# Patient Record
Sex: Female | Born: 1995 | Race: White | Hispanic: No | Marital: Single | Smoking: Never smoker
Health system: Southern US, Community
[De-identification: ages and names within clinical notes are randomized; demographics above are authoritative.]

## PROBLEM LIST (undated history)

## (undated) ENCOUNTER — Inpatient Hospital Stay: Payer: Self-pay

## (undated) DIAGNOSIS — F419 Anxiety disorder, unspecified: Secondary | ICD-10-CM

## (undated) DIAGNOSIS — Z8759 Personal history of other complications of pregnancy, childbirth and the puerperium: Secondary | ICD-10-CM

## (undated) DIAGNOSIS — Z98891 History of uterine scar from previous surgery: Secondary | ICD-10-CM

## (undated) DIAGNOSIS — Z9889 Other specified postprocedural states: Secondary | ICD-10-CM

## (undated) DIAGNOSIS — N870 Mild cervical dysplasia: Secondary | ICD-10-CM

## (undated) DIAGNOSIS — K219 Gastro-esophageal reflux disease without esophagitis: Secondary | ICD-10-CM

## (undated) DIAGNOSIS — Z789 Other specified health status: Secondary | ICD-10-CM

## (undated) DIAGNOSIS — R112 Nausea with vomiting, unspecified: Secondary | ICD-10-CM

## (undated) HISTORY — PX: TONSILLECTOMY: SUR1361

## (undated) HISTORY — PX: WISDOM TOOTH EXTRACTION: SHX21

---
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
## 2016-01-20 ENCOUNTER — Encounter: Payer: Self-pay | Admitting: Emergency Medicine

## 2016-01-20 DIAGNOSIS — R1030 Lower abdominal pain, unspecified: Secondary | ICD-10-CM | POA: Diagnosis present

## 2016-01-20 DIAGNOSIS — N390 Urinary tract infection, site not specified: Secondary | ICD-10-CM | POA: Insufficient documentation

## 2016-01-20 LAB — COMPREHENSIVE METABOLIC PANEL
ALT: 20 U/L (ref 14–54)
AST: 19 U/L (ref 15–41)
Albumin: 5.2 g/dL — ABNORMAL HIGH (ref 3.5–5.0)
Alkaline Phosphatase: 67 U/L (ref 38–126)
Anion gap: 7 (ref 5–15)
BILIRUBIN TOTAL: 0.6 mg/dL (ref 0.3–1.2)
BUN: 8 mg/dL (ref 6–20)
CHLORIDE: 107 mmol/L (ref 101–111)
CO2: 26 mmol/L (ref 22–32)
CREATININE: 0.84 mg/dL (ref 0.44–1.00)
Calcium: 9.8 mg/dL (ref 8.9–10.3)
Glucose, Bld: 94 mg/dL (ref 65–99)
POTASSIUM: 3.8 mmol/L (ref 3.5–5.1)
Sodium: 140 mmol/L (ref 135–145)
TOTAL PROTEIN: 7.9 g/dL (ref 6.5–8.1)

## 2016-01-20 LAB — CBC
HEMATOCRIT: 46.6 % (ref 35.0–47.0)
Hemoglobin: 16.5 g/dL — ABNORMAL HIGH (ref 12.0–16.0)
MCH: 30.4 pg (ref 26.0–34.0)
MCHC: 35.5 g/dL (ref 32.0–36.0)
MCV: 85.6 fL (ref 80.0–100.0)
Platelets: 207 10*3/uL (ref 150–440)
RBC: 5.44 MIL/uL — ABNORMAL HIGH (ref 3.80–5.20)
RDW: 12.9 % (ref 11.5–14.5)
WBC: 10.2 10*3/uL (ref 3.6–11.0)

## 2016-01-20 LAB — URINALYSIS COMPLETE WITH MICROSCOPIC (ARMC ONLY)
BILIRUBIN URINE: NEGATIVE
GLUCOSE, UA: NEGATIVE mg/dL
Ketones, ur: NEGATIVE mg/dL
NITRITE: NEGATIVE
Protein, ur: NEGATIVE mg/dL
Specific Gravity, Urine: 1.017 (ref 1.005–1.030)
pH: 6 (ref 5.0–8.0)

## 2016-01-20 LAB — LIPASE, BLOOD: LIPASE: 42 U/L (ref 11–51)

## 2016-01-20 LAB — POCT PREGNANCY, URINE: Preg Test, Ur: NEGATIVE

## 2016-01-20 NOTE — ED Triage Notes (Signed)
Patient reports that has had intermittent abd pain times two weeks. Patient reports that when the pain began it was in her right lower abd. Patient reports that over the last 3 days that pain has become worse and that the pain has been in her left upper abd. Patient also reports some intermittent dizziness. Patient denies nausea, vomiting, diarrhea, fevers or urinary symptoms.

## 2016-01-21 ENCOUNTER — Emergency Department
Admission: EM | Admit: 2016-01-21 | Discharge: 2016-01-21 | Disposition: A | Discharge status: Home / Self Care | Payer: Medicaid Other | Attending: Emergency Medicine | Admitting: Emergency Medicine

## 2016-01-21 DIAGNOSIS — R1030 Lower abdominal pain, unspecified: Secondary | ICD-10-CM

## 2016-01-21 DIAGNOSIS — N390 Urinary tract infection, site not specified: Secondary | ICD-10-CM

## 2016-01-21 MED ORDER — CEPHALEXIN 500 MG PO CAPS: 500.0000 mg | ORAL_CAPSULE | Freq: Three times a day (TID) | ORAL | 0 refills | Status: DC

## 2016-01-21 MED ORDER — CEPHALEXIN 500 MG PO CAPS
500.0000 mg | ORAL_CAPSULE | Freq: Once | ORAL | Status: AC
  Administered 2016-01-21: 500 mg via ORAL
  Filled 2016-01-21: qty 1

## 2016-01-21 NOTE — ED Provider Notes (Signed)
[facility] Emergency Department Provider Note   ____________________________________________   First MD Initiated Contact with Patient 01/21/16 [phone]     (approximate)  I have reviewed the triage vital signs and the nursing notes.   HISTORY  Chief Complaint Abdominal Pain    HPI Grace Griffin is a 20 y.o. female who presents to the ED with a chief complaint of abdominal pain. Patient reports right upper abdominal pain 2 weeks ago. Now presenting with lower abdominal pain and sometimes left upper pain. Denies associated fever, chills, chest pain, shortness of breath, nausea, vomiting, diarrhea. 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 Reports she used a Summer's Eve douche 2 weeks ago. Denies recent travel or trauma. Nothing makes her symptoms better or worse.   Past medical history None  There are no active problems to display for this patient.   Past Surgical History:  Procedure Laterality Date  . TONSILLECTOMY    . WISDOM TOOTH EXTRACTION      Prior to Admission medications   Medication Sig Start Date End Date Taking? Authorizing Provider  cephALEXin (KEFLEX) 500 MG capsule Take 1 capsule (500 mg total) by mouth 3 (three) times daily. 01/21/16   Irean HongJade J Sung, MD  ondansetron (ZOFRAN ODT) 4 MG disintegrating tablet Take 1 tablet (4 mg total) by mouth every 6 (six) hours as needed for nausea or vomiting. 07/05/15   Sharyn CreamerMark Quale, MD    Allergies Review of patient's allergies indicates no known allergies.  No family history on file.  Social History Social History  Substance Use Topics  . Smoking status: Never Smoker  . Smokeless tobacco: Never Used  . Alcohol use No    Review of Systems  Constitutional: No fever/chills. Eyes: No visual changes. ENT: No sore throat. Cardiovascular: Denies chest pain. Respiratory: Denies shortness of breath. Gastrointestinal: Positive for abdominal pain.  No  nausea, no vomiting.  No diarrhea.  No constipation. Genitourinary: Negative for dysuria. Musculoskeletal: Negative for back pain. Skin: Negative for rash. Neurological: Negative for headaches, focal weakness or numbness.  10-point ROS otherwise negative.  ____________________________________________   PHYSICAL EXAM:  VITAL SIGNS: ED Triage Vitals  Enc Vitals Group     BP 01/20/16 2245 127/85     Pulse Rate 01/20/16 2245 87     Resp 01/20/16 2245 18     Temp 01/20/16 2245 98.1 F (36.7 C)     Temp Source 01/20/16 2245 Oral     SpO2 01/20/16 2245 98 %     Weight 01/20/16 2246 140 lb (63.5 kg)     Height 01/20/16 2246 5\' 3"  (1.6 m)     Head Circumference --      Peak Flow --      Pain Score 01/20/16 2246 3     Pain Loc --      Pain Edu? --      Excl. in GC? --     Constitutional: Alert and oriented. Well appearing and in no acute distress. Eyes: Conjunctivae are normal. PERRL. EOMI. Head: Atraumatic. Nose: No congestion/rhinnorhea. Mouth/Throat: Mucous membranes are moist.  Oropharynx non-erythematous. Neck: No stridor.   Cardiovascular: Normal rate, regular rhythm. Grossly normal heart sounds.  Good peripheral circulation. Respiratory: Normal respiratory effort.  No retractions. Lungs CTAB. Gastrointestinal: Soft and nontender to light and deep palpation. No distention. No abdominal bruits. No CVA tenderness. Musculoskeletal: No lower extremity tenderness nor edema.  No joint effusions. Neurologic:  Normal  speech and language. No gross focal neurologic deficits are appreciated. No gait instability. Skin:  Skin is warm, dry and intact. No rash noted. Psychiatric: Mood and affect are normal. Speech and behavior are normal.  ____________________________________________   LABS (all labs ordered are listed, but only abnormal results are displayed)  Labs Reviewed  COMPREHENSIVE METABOLIC PANEL - Abnormal; Notable for the following:       Result Value   Albumin 5.2 (*)     All other components within normal limits  CBC - Abnormal; Notable for the following:    RBC 5.44 (*)    Hemoglobin 16.5 (*)    All other components within normal limits  URINALYSIS COMPLETEWITH MICROSCOPIC ([facility] ONLY) - Abnormal; Notable for the following:    Color, Urine YELLOW (*)    APPearance HAZY (*)    Hgb urine dipstick 1+ (*)    Leukocytes, UA 3+ (*)    Bacteria, UA RARE (*)    Squamous Epithelial / LPF 6-30 (*)    All other components within normal limits  LIPASE, BLOOD  POCT PREGNANCY, URINE  POC URINE PREG, ED   ____________________________________________  EKG  None ____________________________________________  RADIOLOGY  None ____________________________________________   PROCEDURES  Procedure(s) performed: None  Procedures  Critical Care performed: No  ____________________________________________   INITIAL IMPRESSION / ASSESSMENT AND PLAN / ED COURSE  Pertinent labs & imaging results that were available during my care of the patient were reviewed by me and considered in my medical decision making (see chart for details).  20 year old female who presents with abdominal pain without associated nausea, vomiting or diarrhea. She is well appearing on exam, abdomen benign with 3+ leukocyte and TNTC WBC in her urine. Patient declines IV or IM medications. In fact, states she cannot swallow a pill. Will treat with Keflex capsules that she can break open and mix with applesauce. Strict return precautions given. Patient verbalizes understanding and agrees with plan of care.  Clinical Course     ____________________________________________   FINAL CLINICAL IMPRESSION(S) / ED DIAGNOSES  Final diagnoses:  Lower abdominal pain  UTI (lower urinary tract infection)      NEW MEDICATIONS STARTED DURING THIS VISIT:  New Prescriptions   CEPHALEXIN (KEFLEX) 500 MG CAPSULE    Take 1 capsule (500 mg total) by mouth 3 (three) times daily.     Note:   This document was prepared using Dragon voice recognition software and may include unintentional dictation errors.    Irean HongJade J Sung, MD 01/21/16 [phone]

## 2016-01-21 NOTE — Discharge Instructions (Signed)
1. Take antibiotic as prescribed (Keflex 500 mg 3 times daily 7 days). 2. Return to the ER for worsening symptoms, persistent vomiting, fever or other concerns.

## 2017-01-25 ENCOUNTER — Telehealth: Payer: Self-pay | Admitting: Obstetrics and Gynecology

## 2017-01-25 NOTE — Telephone Encounter (Signed)
Called and spoke with patient in regards to referral from [facility] practice, The patient declined appointment, due to her having medicaid family planning, The patient is not able to afford the services. The patient was contacted and advised to go to the health department and also [facility] practice was contacted and informed that the patient declined. Thank you.

## 2017-03-26 ENCOUNTER — Encounter: Payer: Self-pay | Admitting: Emergency Medicine

## 2017-03-26 ENCOUNTER — Emergency Department
Admission: EM | Admit: 2017-03-26 | Discharge: 2017-03-26 | Disposition: A | Discharge status: Home / Self Care | Payer: Medicaid Other | Attending: Emergency Medicine | Admitting: Emergency Medicine

## 2017-03-26 DIAGNOSIS — M545 Low back pain: Secondary | ICD-10-CM | POA: Insufficient documentation

## 2017-03-26 DIAGNOSIS — Y9389 Activity, other specified: Secondary | ICD-10-CM | POA: Diagnosis not present

## 2017-03-26 DIAGNOSIS — Z79899 Other long term (current) drug therapy: Secondary | ICD-10-CM | POA: Insufficient documentation

## 2017-03-26 DIAGNOSIS — Y999 Unspecified external cause status: Secondary | ICD-10-CM | POA: Diagnosis not present

## 2017-03-26 DIAGNOSIS — Y929 Unspecified place or not applicable: Secondary | ICD-10-CM | POA: Insufficient documentation

## 2017-03-26 MED ORDER — IBUPROFEN 600 MG PO TABS: 600.0000 mg | ORAL_TABLET | Freq: Four times a day (QID) | ORAL | 0 refills | Status: DC | PRN

## 2017-03-26 MED ORDER — CYCLOBENZAPRINE HCL 5 MG PO TABS
5.0000 mg | ORAL_TABLET | Freq: Three times a day (TID) | ORAL | 0 refills | Status: AC | PRN
Start: 2017-03-26 — End: 2017-04-02

## 2017-03-26 MED ORDER — DIAZEPAM 2 MG PO TABS
2.0000 mg | ORAL_TABLET | Freq: Once | ORAL | Status: AC
  Administered 2017-03-26: 2 mg via ORAL
  Filled 2017-03-26: qty 1

## 2017-03-26 MED ORDER — IBUPROFEN 600 MG PO TABS
600.0000 mg | ORAL_TABLET | Freq: Once | ORAL | Status: AC
  Administered 2017-03-26: 600 mg via ORAL
  Filled 2017-03-26: qty 1

## 2017-03-26 MED ORDER — ORPHENADRINE CITRATE 30 MG/ML IJ SOLN
60.0000 mg | Freq: Two times a day (BID) | INTRAMUSCULAR | Status: DC
  Administered 2017-03-26: 60 mg via INTRAMUSCULAR
  Filled 2017-03-26: qty 2
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
## 2017-06-02 ENCOUNTER — Telehealth: Payer: Self-pay

## 2017-06-02 NOTE — Telephone Encounter (Signed)
Pt has PNV from [facility], has been taking them fine until last night she had trouble getting it down.  She finally had to chop it up to get it down.  She wanted to know if there was anything otc with the same things in it that was a gummy.  Adv she could take current bottle with her to pharm and compare ingredients and have pharmacist help her or wait until next appt and discuss with provider.  Pt sounded like she would wait and talk c provider.

## 2017-06-07 ENCOUNTER — Encounter: Payer: Self-pay | Admitting: Maternal Newborn

## 2017-06-07 ENCOUNTER — Ambulatory Visit (INDEPENDENT_AMBULATORY_CARE_PROVIDER_SITE_OTHER): Payer: Medicaid Other | Admitting: Maternal Newborn

## 2017-06-07 VITALS — BP 120/80 | Wt 183.0 lb

## 2017-06-07 DIAGNOSIS — Z6834 Body mass index (BMI) 34.0-34.9, adult: Secondary | ICD-10-CM | POA: Insufficient documentation

## 2017-06-07 DIAGNOSIS — Z0189 Encounter for other specified special examinations: Secondary | ICD-10-CM

## 2017-06-07 DIAGNOSIS — Z3400 Encounter for supervision of normal first pregnancy, unspecified trimester: Secondary | ICD-10-CM

## 2017-06-07 DIAGNOSIS — Z6831 Body mass index (BMI) 31.0-31.9, adult: Secondary | ICD-10-CM

## 2017-06-07 NOTE — Progress Notes (Signed)
06/07/2017   Chief Complaint: Amenorrhea, positive home pregnancy test, desires prenatal care.  Transfer of Care Patient: no   History of Present Illness: Ms. Barth KirksMichaels is a 22 y.o. G1P0000 at 7587w1d based on Patient's last menstrual period on 04/25/2017 with an Estimated Date of Delivery: 01/30/18, with the above CC.   Her periods were: irregular periods, had no cycles for 9 months after stopping DepoProvera, then approximately monthly the past few cycles. She was using no method when she conceived.  She has Positive signs or symptoms of nausea/vomiting of pregnancy. She has Negative signs or symptoms of miscarriage or preterm labor. She identifies Negative Zika risk factors for her and her partner. On any different medications around the time she conceived/early pregnancy: No.  History of varicella: Yes.   ROS: A 12-point review of systems was performed and negative, except as stated in the above HPI.  OBGYN History: As per HPI. OB History  Gravida Para Term Preterm AB Living  1 0 0 0 0 0  SAB TAB Ectopic Multiple Live Births  0 0 0 0 0    # Outcome Date GA Lbr Len/2nd Weight Sex Delivery Anes PTL Lv  1 Current               Any issues with any prior pregnancies: not applicable Any prior children are healthy, doing well, without any problems or issues: not applicable History of pap smears: Yes. Last pap smear done in 2018 per patient with normal cells and positive HPV (note, not indicated as patient is <21).  History of STIs: Yes, chlamydia and HPV   Past Medical History: History reviewed. No pertinent past medical history.  Past Surgical History: Past Surgical History:  Procedure Laterality Date  . TONSILLECTOMY     AND ADENOIDECTOMY  . WISDOM TOOTH EXTRACTION      Family History:  Family History  Problem Relation Age of Onset  . Breast cancer Paternal Grandmother    She denies any female cancers, bleeding or blood clotting disorders.  She denies any history of  intellectual disability, birth defects or genetic disorders in her or the FOB's history.  Social History:  Social History   Socioeconomic History  . Marital status: Single    Spouse name: Not on file  . Number of children: Not on file  . Years of education: Not on file  . Highest education level: Not on file  Social Needs  . Financial resource strain: Not on file  . Food insecurity - worry: Not on file  . Food insecurity - inability: Not on file  . Transportation needs - medical: Not on file  . Transportation needs - non-medical: Not on file  Occupational History  . Not on file  Tobacco Use  . Smoking status: Never Smoker  . Smokeless tobacco: Never Used  Substance and Sexual Activity  . Alcohol use: No  . Drug use: No  . Sexual activity: Yes  Other Topics Concern  . Not on file  Social History Narrative  . Not on file   Any cats in the household: no. Denies history of and current domestic violence.  Allergy: No Known Allergies  Current Outpatient Medications: No current outpatient medications on file.   Physical Exam:   BP 120/80   Wt 183 lb (83 kg)   LMP 04/25/2017   BMI 32.42 kg/m  Body mass index is 32.42 kg/m. Constitutional: Well nourished, well developed female in no acute distress.  Neck:  Supple, normal appearance, and  no thyromegaly  Cardiovascular: S1, S2 normal, no murmur, rub or gallop, regular rate and rhythm Respiratory:  Clear to auscultation bilaterally. Normal respiratory effort Abdomen: no masses, hernias; diffusely non tender to palpation, non distended Breasts: breasts appear normal, no suspicious masses, no skin or nipple changes or axillary nodes, risk and benefit of breast self-exam was discussed. Neuro/Psych:  Normal mood and affect.  Skin:  Warm and dry.  Lymphatic:  No inguinal lymphadenopathy.   Pelvic exam: is not limited by body habitus External genitalia, Bartholin's glands, Urethra, Skene's glands: within normal  limits Vagina: within normal limits and with no blood in the vault  Cervix: normal appearing cervix without discharge or lesions, closed/long/high Uterus:  enlarged, c/w early pregnancy Adnexa:  no mass, fullness, tenderness  Assessment: Ms. Shadowens is a 22 y.o. G1P0000 [redacted]w[redacted]d based on Patient's last menstrual period was 04/25/2017. with an Estimated Date of Delivery: 01/30/18, presenting for prenatal care.  Plan:  1) Avoid alcoholic beverages. 2) Patient encouraged not to smoke.  3) Discontinue the use of all non-medicinal drugs and chemicals.  4) Take prenatal vitamins daily. Unable to tolerate regular vitamins and is using gummies. Advised about folate and iron and gave vitamin samples today. 5) Seatbelt use advised 6) Nutrition, food safety (fish, cheese advisories, and high nitrite foods) and exercise discussed. 7) Hospital and practice style delivering at [facility] discussed  8) Patient is asked about travel to areas at risk for the Zika virus, and counseled to avoid travel and exposure to mosquitoes or sexual partners who may have themselves been exposed to the virus. Testing is discussed, and will be ordered as appropriate.  9) Childbirth classes at [facility] advised 10) Genetic Screening, such as with 1st Trimester Screening, cell free fetal DNA, AFP testing, and Ultrasound, as well as with amniocentesis and CVS as appropriate, is discussed with patient. She plans to have genetic testing this pregnancy. 11) GTT ordered for BMI of 31, will return fasting  for lab draw with NOB labs. 12) Dating/viability ultrasound within 1 week. 13) Samples given of Bonjesta for nausea, patient to call for Rx as needed.  Problem list reviewed and updated.  Return in about 1 week (around 06/14/2017) for ROB with GTT/labs/ultrasound.  Marcelyn Bruins, CNM [facility], [facility] 06/07/2017  11:55 AM

## 2017-06-07 NOTE — Progress Notes (Signed)
No concerns.rj 

## 2017-06-07 NOTE — Patient Instructions (Signed)
First Trimester of Pregnancy The first trimester of pregnancy is from week 1 until the end of week 13 (months 1 through 3). A week after a sperm fertilizes an egg, the egg will implant on the wall of the uterus. This embryo will begin to develop into a baby. Genes from you and your partner will form the baby. The female genes will determine whether the baby will be a boy or a girl. At 6-8 weeks, the eyes and face will be formed, and the heartbeat can be seen on ultrasound. At the end of 12 weeks, all the baby's organs will be formed. Now that you are pregnant, you will want to do everything you can to have a healthy baby. Two of the most important things are to get good prenatal care and to follow your health care provider's instructions. Prenatal care is all the medical care you receive before the baby's birth. This care will help prevent, find, and treat any problems during the pregnancy and childbirth. Body changes during your first trimester Your body goes through many changes during pregnancy. The changes vary from woman to woman.  You may gain or lose a couple of pounds at first.  You may feel sick to your stomach (nauseous) and you may throw up (vomit). If the vomiting is uncontrollable, call your health care provider.  You may tire easily.  You may develop headaches that can be relieved by medicines. All medicines should be approved by your health care provider.  You may urinate more often. Painful urination may mean you have a bladder infection.  You may develop heartburn as a result of your pregnancy.  You may develop constipation because certain hormones are causing the muscles that push stool through your intestines to slow down.  You may develop hemorrhoids or swollen veins (varicose veins).  Your breasts may begin to grow larger and become tender. Your nipples may stick out more, and the tissue that surrounds them (areola) may become darker.  Your gums may bleed and may be  sensitive to brushing and flossing.  Dark spots or blotches (chloasma, mask of pregnancy) may develop on your face. This will likely fade after the baby is born.  Your menstrual periods will stop.  You may have a loss of appetite.  You may develop cravings for certain kinds of food.  You may have changes in your emotions from day to day, such as being excited to be pregnant or being concerned that something may go wrong with the pregnancy and baby.  You may have more vivid and strange dreams.  You may have changes in your hair. These can include thickening of your hair, rapid growth, and changes in texture. Some women also have hair loss during or after pregnancy, or hair that feels dry or thin. Your hair will most likely return to normal after your baby is born.  What to expect at prenatal visits During a routine prenatal visit:  You will be weighed to make sure you and the baby are growing normally.  Your blood pressure will be taken.  Your abdomen will be measured to track your baby's growth.  The fetal heartbeat will be listened to between weeks 10 and 14 of your pregnancy.  Test results from any previous visits will be discussed.  Your health care provider may ask you:  How you are feeling.  If you are feeling the baby move.  If you have had any abnormal symptoms, such as leaking fluid, bleeding, severe headaches,   or abdominal cramping.  If you are using any tobacco products, including cigarettes, chewing tobacco, and electronic cigarettes.  If you have any questions.  Other tests that may be performed during your first trimester include:  Blood tests to find your blood type and to check for the presence of any previous infections. The tests will also be used to check for low iron levels (anemia) and protein on red blood cells (Rh antibodies). Depending on your risk factors, or if you previously had diabetes during pregnancy, you may have tests to check for high blood  sugar that affects pregnant women (gestational diabetes).  Urine tests to check for infections, diabetes, or protein in the urine.  An ultrasound to confirm the proper growth and development of the baby.  Fetal screens for spinal cord problems (spina bifida) and Down syndrome.  HIV (human immunodeficiency virus) testing. Routine prenatal testing includes screening for HIV, unless you choose not to have this test.  You may need other tests to make sure you and the baby are doing well.  Follow these instructions at home: Medicines  Follow your health care provider's instructions regarding medicine use. Specific medicines may be either safe or unsafe to take during pregnancy.  Take a prenatal vitamin that contains at least 600 micrograms (mcg) of folic acid.  If you develop constipation, try taking a stool softener if your health care provider approves. Eating and drinking  Eat a balanced diet that includes fresh fruits and vegetables, whole grains, good sources of protein such as meat, eggs, or tofu, and low-fat dairy. Your health care provider will help you determine the amount of weight gain that is right for you.  Avoid raw meat and uncooked cheese. These carry germs that can cause birth defects in the baby.  Eating four or five small meals rather than three large meals a day may help relieve nausea and vomiting. If you start to feel nauseous, eating a few soda crackers can be helpful. Drinking liquids between meals, instead of during meals, also seems to help ease nausea and vomiting.  Limit foods that are high in fat and processed sugars, such as fried and sweet foods.  To prevent constipation: ? Eat foods that are high in fiber, such as fresh fruits and vegetables, whole grains, and beans. ? Drink enough fluid to keep your urine clear or pale yellow. Activity  Exercise only as directed by your health care provider. Most women can continue their usual exercise routine during  pregnancy. Try to exercise for 30 minutes at least 5 days a week. Exercising will help you: ? Control your weight. ? Stay in shape. ? Be prepared for labor and delivery.  Experiencing pain or cramping in the lower abdomen or lower back is a good sign that you should stop exercising. Check with your health care provider before continuing with normal exercises.  Try to avoid standing for long periods of time. Move your legs often if you must stand in one place for a long time.  Avoid heavy lifting.  Wear low-heeled shoes and practice good posture.  You may continue to have sex unless your health care provider tells you not to. Relieving pain and discomfort  Wear a good support bra to relieve breast tenderness.  Take warm sitz baths to soothe any pain or discomfort caused by hemorrhoids. Use hemorrhoid cream if your health care provider approves.  Rest with your legs elevated if you have leg cramps or low back pain.  If you develop   varicose veins in your legs, wear support hose. Elevate your feet for 15 minutes, 3-4 times a day. Limit salt in your diet. Prenatal care  Schedule your prenatal visits by the twelfth week of pregnancy. They are usually scheduled monthly at first, then more often in the last 2 months before delivery.  Write down your questions. Take them to your prenatal visits.  Keep all your prenatal visits as told by your health care provider. This is important. Safety  Wear your seat belt at all times when driving.  Make a list of emergency phone numbers, including numbers for family, friends, the hospital, and police and fire departments. General instructions  Ask your health care provider for a referral to a local prenatal education class. Begin classes no later than the beginning of month 6 of your pregnancy.  Ask for help if you have counseling or nutritional needs during pregnancy. Your health care provider can offer advice or refer you to specialists for help  with various needs.  Do not use hot tubs, steam rooms, or saunas.  Do not douche or use tampons or scented sanitary pads.  Do not cross your legs for long periods of time.  Avoid cat litter boxes and soil used by cats. These carry germs that can cause birth defects in the baby and possibly loss of the fetus by miscarriage or stillbirth.  Avoid all smoking, herbs, alcohol, and medicines not prescribed by your health care provider. Chemicals in these products affect the formation and growth of the baby.  Do not use any products that contain nicotine or tobacco, such as cigarettes and e-cigarettes. If you need help quitting, ask your health care provider. You may receive counseling support and other resources to help you quit.  Schedule a dentist appointment. At home, brush your teeth with a soft toothbrush and be gentle when you floss. Contact a health care provider if:  You have dizziness.  You have mild pelvic cramps, pelvic pressure, or nagging pain in the abdominal area.  You have persistent nausea, vomiting, or diarrhea.  You have a bad smelling vaginal discharge.  You have pain when you urinate.  You notice increased swelling in your face, hands, legs, or ankles.  You are exposed to fifth disease or chickenpox.  You are exposed to German measles (rubella) and have never had it. Get help right away if:  You have a fever.  You are leaking fluid from your vagina.  You have spotting or bleeding from your vagina.  You have severe abdominal cramping or pain.  You have rapid weight gain or loss.  You vomit blood or material that looks like coffee grounds.  You develop a severe headache.  You have shortness of breath.  You have any kind of trauma, such as from a fall or a car accident. Summary  The first trimester of pregnancy is from week 1 until the end of week 13 (months 1 through 3).  Your body goes through many changes during pregnancy. The changes vary from  woman to woman.  You will have routine prenatal visits. During those visits, your health care provider will examine you, discuss any test results you may have, and talk with you about how you are feeling. This information is not intended to replace advice given to you by your health care provider. Make sure you discuss any questions you have with your health care provider. Document Released: 05/11/2001 Document Revised: 04/28/2016 Document Reviewed: 04/28/2016 Elsevier Interactive Patient Education  2018 Elsevier   Inc.  

## 2017-06-09 LAB — URINE DRUG PANEL 7
Amphetamines, Urine: NEGATIVE ng/mL
BENZODIAZEPINE QUANT UR: NEGATIVE ng/mL
Barbiturate Quant, Ur: NEGATIVE ng/mL
COCAINE (METAB.): NEGATIVE ng/mL
Cannabinoid Quant, Ur: NEGATIVE ng/mL
Opiate Quant, Ur: NEGATIVE ng/mL
PCP QUANT UR: NEGATIVE ng/mL

## 2017-06-09 LAB — URINE CULTURE

## 2017-06-09 LAB — GC/CHLAMYDIA PROBE AMP
Chlamydia trachomatis, NAA: NEGATIVE
Neisseria gonorrhoeae by PCR: NEGATIVE

## 2017-06-13 ENCOUNTER — Other Ambulatory Visit: Payer: Self-pay | Admitting: Maternal Newborn

## 2017-06-13 ENCOUNTER — Ambulatory Visit (INDEPENDENT_AMBULATORY_CARE_PROVIDER_SITE_OTHER): Payer: Medicaid Other

## 2017-06-13 ENCOUNTER — Ambulatory Visit: Payer: Medicaid Other

## 2017-06-13 ENCOUNTER — Encounter: Payer: Self-pay | Admitting: Obstetrics and Gynecology

## 2017-06-13 ENCOUNTER — Ambulatory Visit (INDEPENDENT_AMBULATORY_CARE_PROVIDER_SITE_OTHER): Payer: Medicaid Other | Admitting: Obstetrics and Gynecology

## 2017-06-13 VITALS — BP 118/74 | Wt 184.0 lb

## 2017-06-13 DIAGNOSIS — Z3400 Encounter for supervision of normal first pregnancy, unspecified trimester: Secondary | ICD-10-CM

## 2017-06-13 DIAGNOSIS — R8271 Bacteriuria: Secondary | ICD-10-CM

## 2017-06-13 DIAGNOSIS — Z0189 Encounter for other specified special examinations: Secondary | ICD-10-CM | POA: Diagnosis not present

## 2017-06-13 DIAGNOSIS — Z3A01 Less than 8 weeks gestation of pregnancy: Secondary | ICD-10-CM | POA: Insufficient documentation

## 2017-06-13 DIAGNOSIS — Z6831 Body mass index (BMI) 31.0-31.9, adult: Secondary | ICD-10-CM

## 2017-06-13 LAB — OB RESULTS CONSOLE VARICELLA ZOSTER ANTIBODY, IGG: Varicella: IMMUNE

## 2017-06-13 MED ORDER — AMOXICILLIN 250 MG/5ML PO SUSR: 500.0000 mg | Freq: Three times a day (TID) | ORAL | 0 refills | Status: AC

## 2017-06-13 MED ORDER — AMOXICILLIN 500 MG PO CAPS: 500.0000 mg | ORAL_CAPSULE | Freq: Three times a day (TID) | ORAL | 0 refills | Status: DC

## 2017-06-13 NOTE — Progress Notes (Signed)
Notified patient of positive GBS urine culture and sent Rx to pharmacy.  Marcelyn BruinsJacelyn Brett Darko, CNM 06/13/2017  10:00 AM

## 2017-06-13 NOTE — Progress Notes (Signed)
  Routine Prenatal Care Visit  Subjective  Grace Griffin is a 22 y.o. G1P0000 at 6249w0d being seen today for ongoing prenatal care.  She is currently monitored for the following issues for this low-risk pregnancy and has Supervision of normal first pregnancy, antepartum; BMI 31.0-31.9,adult; GBS bacteriuria; and [redacted] weeks gestation of pregnancy on their problem list.  ----------------------------------------------------------------------------------- Patient reports no complaints.    . Vag. Bleeding: None.   . Denies leaking of fluid.  US today confirms EDD ----------------------------------------------------------------------------------- The following portions of the patient's history were reviewed and updated as appropriate: allergies, current medications, past family history, past medical history, past social history, past surgical history and problem list. Problem list updated.  Objective  Blood pressure 118/74, weight 184 lb (83.5 kg), last menstrual period 04/25/2017. Pregravid weight 180 lb (81.6 kg) Total Weight Gain 4 lb (1.814 kg) Urinalysis: Urine Protein: Negative Urine Glucose: Negative  Fetal Status: Fetal Heart Rate (bpm): Present         General:  Alert, oriented and cooperative. Patient is in no acute distress.  Skin: Skin is warm and dry. No rash noted.   Cardiovascular: Normal heart rate noted  Respiratory: Normal respiratory effort, no problems with respiration noted  Abdomen: Soft, gravid, appropriate for gestational age. Pain/Pressure: Absent     Pelvic:  Cervical exam deferred        Extremities: Normal range of motion.     Mental Status: Normal mood and affect. Normal behavior. Normal judgment and thought content.   Assessment   22 y.o. G1P0000 at 1549w0d by  01/30/2018, by Last Menstrual Period presenting for routine prenatal visit  Plan   FIRST Problems (from 06/07/17 to present)    Problem Noted Resolved   GBS bacteriuria 06/13/2017 by Oswaldo ConroySchmid, Jacelyn Y, CNM  No   [redacted] weeks gestation of pregnancy 06/13/2017 by Conard NovakJackson, Rahn Lacuesta D, MD No   Supervision of normal first pregnancy, antepartum 06/07/2017 by Oswaldo ConroySchmid, Jacelyn Y, CNM No   Overview Signed 06/07/2017  9:36 AM by Oswaldo ConroySchmid, Jacelyn Y, CNM    [facility]inic [facility] Prenatal [facility]  Dating  Blood type:     Genetic Screen 1 Screen:    AFP:     Quad:     NIPS: Antibody:   Anatomic US  Rubella:   Varicella:    GTT Early:               Third trimester:  RPR:     Rhogam  HBsAg:     TDaP vaccine                       Flu Shot: HIV:     Baby Food                                GBS:   Contraception  Pap:  CBB     CS/VBAC    Support Person                Please refer to After Visit Summary for other counseling recommendations.   Return in about 4 weeks (around 07/11/2017) for schedule NT u/s and routine prenatal.  - NOB labs today - 1h gtt today - u/s today confirms EDD (4 day diff w definite LMP) - desires genetic screening. Will get at next visit - amox called in to pharm for GBS bacteriuria.   Thomasene MohairStephen Vetra Shinall, MD  06/13/2017 2:40 PM

## 2017-06-13 NOTE — Progress Notes (Signed)
Changed Rx to liquid as patient cannot tolerate capsules.  Marcelyn BruinsJacelyn Schmid, CNM 06/13/2017  4:43 PM

## 2017-06-14 ENCOUNTER — Other Ambulatory Visit: Payer: Self-pay | Admitting: Maternal Newborn

## 2017-06-14 ENCOUNTER — Telehealth: Payer: Self-pay

## 2017-06-14 DIAGNOSIS — Z3400 Encounter for supervision of normal first pregnancy, unspecified trimester: Secondary | ICD-10-CM

## 2017-06-14 DIAGNOSIS — O219 Vomiting of pregnancy, unspecified: Secondary | ICD-10-CM

## 2017-06-14 LAB — RPR+RH+ABO+RUB AB+AB SCR+CB...
ANTIBODY SCREEN: NEGATIVE
HIV Screen 4th Generation wRfx: NONREACTIVE
Hematocrit: 42.9 % (ref 34.0–46.6)
Hemoglobin: 14.5 g/dL (ref 11.1–15.9)
Hepatitis B Surface Ag: NEGATIVE
MCH: 28.9 pg (ref 26.6–33.0)
MCHC: 33.8 g/dL (ref 31.5–35.7)
MCV: 86 fL (ref 79–97)
PLATELETS: 244 10*3/uL (ref 150–379)
RBC: 5.02 x10E6/uL (ref 3.77–5.28)
RDW: 13 % (ref 12.3–15.4)
RH TYPE: POSITIVE
RPR Ser Ql: NONREACTIVE
RUBELLA: 5.78 {index} (ref >0.99)
Varicella zoster IgG: 198 index (ref >165)
WBC: 9.1 10*3/uL (ref 3.4–10.8)

## 2017-06-14 LAB — GLUCOSE, 1 HOUR GESTATIONAL: Gestational Diabetes Screen: 63 mg/dL — ABNORMAL LOW (ref 65–139)

## 2017-06-14 MED ORDER — DOXYLAMINE-PYRIDOXINE 10-10 MG PO TBEC: 2.0000 | DELAYED_RELEASE_TABLET | Freq: Every day | ORAL | 5 refills | Status: DC

## 2017-06-14 NOTE — Telephone Encounter (Signed)
Pt took Bonjesta in the middle of the night as she forgot at bedtime. Pt states she was very groggy & wonders if this is normal. Notified meds can cause drowsiness. Advised to take at bedtime. Pt was given samples and would like rx sent to pharmacy verified on file. Meds work well.

## 2017-06-14 NOTE — Telephone Encounter (Signed)
Pt has question about medication that she is taking.

## 2017-06-14 NOTE — Progress Notes (Signed)
Sent Rx for Diclegis.  Marcelyn BruinsJacelyn Mischa Pollard, CNM 06/14/2017  4:40 PM

## 2017-06-17 ENCOUNTER — Encounter: Payer: Self-pay | Admitting: Obstetrics and Gynecology

## 2017-06-20 ENCOUNTER — Encounter: Payer: Self-pay | Admitting: Obstetrics and Gynecology

## 2017-06-20 NOTE — Telephone Encounter (Signed)
Pt called triage line this morning with c/o head cold. Again advised tylenol and mucinex. She completed course of amoxicillin and now feels like she may have a yeast infection. She previously had a prescription of clindamycin vaginal cream from the [facility] and has used it for the past 2 nights and wants to make sure this is okay. Advised that we usually recommend OTC monistat for yeast infections during pregnancy. Pt requests call back. [phone] thank you.

## 2017-06-27 ENCOUNTER — Ambulatory Visit (INDEPENDENT_AMBULATORY_CARE_PROVIDER_SITE_OTHER): Payer: Medicaid Other | Admitting: Obstetrics and Gynecology

## 2017-06-27 ENCOUNTER — Encounter: Payer: Self-pay | Admitting: Obstetrics and Gynecology

## 2017-06-27 VITALS — BP 122/78 | Wt 180.0 lb

## 2017-06-27 DIAGNOSIS — B349 Viral infection, unspecified: Secondary | ICD-10-CM | POA: Diagnosis not present

## 2017-06-27 DIAGNOSIS — Z3A09 9 weeks gestation of pregnancy: Secondary | ICD-10-CM | POA: Diagnosis not present

## 2017-06-27 DIAGNOSIS — J029 Acute pharyngitis, unspecified: Secondary | ICD-10-CM | POA: Diagnosis not present

## 2017-06-27 NOTE — Progress Notes (Signed)
[employer] in [employer] for nausea, ear ache, cold chills, aches, no fever, sore throat.

## 2017-06-27 NOTE — Progress Notes (Signed)
Chief Complaint  Patient presents with  . URI    HPI:      Grace Griffin is a [age] y.o. G1P0000 who LMP was Patient's last menstrual period was 04/25/2017., presents today for sore throat for the past 4 days with LAN, ear pain, chills and now a dry cough. She had URI over a wk prior to these sx but they resolved. Pt noted myalgias, chills and pelvic cramping the first day of sx, but these have resolved. She didn't have a thermometer to check sx. She has been unable to eat or drink much due to sore throat pain. She is s/p tonsillectomy. Has tried tylenol but this isn't helping her.  She is [redacted] wks pregnant. No VB, spotting. Cramping resolved. She takes bonjesta but it makes her very tired and feels out of it.   History reviewed. No pertinent past medical history.  Past Surgical History:  Procedure Laterality Date  . TONSILLECTOMY     AND ADENOIDECTOMY  . WISDOM TOOTH EXTRACTION      Family History  Problem Relation Age of Onset  . Breast cancer Paternal Grandmother     Social History   Socioeconomic History  . Marital status: Single    Spouse name: Not on file  . Number of children: Not on file  . Years of education: Not on file  . Highest education level: Not on file  Social Needs  . Financial resource strain: Not on file  . Food insecurity - worry: Not on file  . Food insecurity - inability: Not on file  . Transportation needs - medical: Not on file  . Transportation needs - non-medical: Not on file  Occupational History  . Not on file  Tobacco Use  . Smoking status: Never Smoker  . Smokeless tobacco: Never Used  Substance and Sexual Activity  . Alcohol use: No  . Drug use: No  . Sexual activity: Yes  Other Topics Concern  . Not on file  Social History Narrative  . Not on file     Current Outpatient Medications:  .  Doxylamine-Pyridoxine (DICLEGIS) 10-10 MG TBEC, Take 2 tablets by mouth at bedtime. If symptoms persist, add one tablet in the morning and  one in the afternoon, Disp: 100 tablet, Rfl: 5   ROS:  Review of Systems  Constitutional: Positive for chills. Negative for fatigue and fever.  HENT: Positive for ear pain and sore throat. Negative for congestion, postnasal drip, rhinorrhea, sinus pressure and sinus pain.   Respiratory: Positive for cough. Negative for chest tightness, shortness of breath and wheezing.   Skin: Negative for rash.  Neurological: Negative for dizziness, light-headedness and headaches.     OBJECTIVE:   Vitals:  BP 122/78   Wt 180 lb (81.6 kg)   LMP 04/25/2017   BMI 31.89 kg/m   Physical Exam  Constitutional: She is oriented to person, place, and time and well-developed, well-nourished, and in no distress.  HENT:  Right Ear: Hearing, external ear and ear canal normal. Tympanic membrane is not injected, not scarred, not perforated and not erythematous. A middle ear effusion is present.  Left Ear: Hearing, external ear and ear canal normal. Tympanic membrane is not injected, not scarred, not perforated and not erythematous.  No middle ear effusion.  Mouth/Throat: Mucous membranes are not pale and not dry. No oral lesions. Posterior oropharyngeal erythema present. No oropharyngeal exudate, posterior oropharyngeal edema or tonsillar abscesses.  Neck: Normal range of motion. No thyromegaly present.  Cardiovascular:  Normal rate and regular rhythm.  Pulmonary/Chest: Effort normal and breath sounds normal. No respiratory distress. She has no wheezes. She has no rales.  Lymphadenopathy:       Head (right side): No submandibular, no tonsillar, no preauricular and no posterior auricular adenopathy present.       Head (left side): No submandibular, no tonsillar, no preauricular and no posterior auricular adenopathy present.    She has cervical adenopathy.  Neurological: She is alert and oriented to person, place, and time.  Psychiatric: Affect and judgment normal.  Vitals  reviewed.   Assessment/Plan: Pharyngitis with viral syndrome - Can't do strep test here. Most likely viral. Rest/fluids/tylenol/warm salt water gargles. F/u prn.   [redacted] weeks gestation of pregnancy - No VB, spotting. Increase fluids. F/u prn.     Return if symptoms worsen or fail to improve.  Dalayah Deahl B. Andjela Wickes, PA-C 06/27/2017 4:21 PM

## 2017-06-27 NOTE — Patient Instructions (Signed)
I value your feedback and entrusting us with your care. If you get a [facility] patient survey, I would appreciate you taking the time to let us know about your experience today. Thank you! 

## 2017-07-11 ENCOUNTER — Ambulatory Visit (INDEPENDENT_AMBULATORY_CARE_PROVIDER_SITE_OTHER): Payer: Medicaid Other

## 2017-07-11 ENCOUNTER — Ambulatory Visit (INDEPENDENT_AMBULATORY_CARE_PROVIDER_SITE_OTHER): Payer: Medicaid Other | Admitting: Maternal Newborn

## 2017-07-11 ENCOUNTER — Encounter: Payer: Self-pay | Admitting: Maternal Newborn

## 2017-07-11 VITALS — BP 126/76 | Wt 179.0 lb

## 2017-07-11 DIAGNOSIS — Z3400 Encounter for supervision of normal first pregnancy, unspecified trimester: Secondary | ICD-10-CM | POA: Diagnosis not present

## 2017-07-11 DIAGNOSIS — Z3A11 11 weeks gestation of pregnancy: Secondary | ICD-10-CM

## 2017-07-11 DIAGNOSIS — Z1379 Encounter for other screening for genetic and chromosomal anomalies: Secondary | ICD-10-CM

## 2017-07-11 NOTE — Progress Notes (Signed)
    Routine Prenatal Care Visit  Subjective  Grace Griffin is a 22 y.o. G1P0000 at 5559w0d being seen today for ongoing prenatal care.  She is currently monitored for the following issues for this low-risk pregnancy and has Supervision of normal first pregnancy, antepartum; BMI 31.0-31.9,adult; GBS bacteriuria; and [redacted] weeks gestation of pregnancy on their problem list.  ----------------------------------------------------------------------------------- Patient reports no complaints.   Vag. Bleeding: None. Denies leaking of fluid.  ----------------------------------------------------------------------------------- The following portions of the patient's history were reviewed and updated as appropriate: allergies, current medications, past family history, past medical history, past social history, past surgical history and problem list. Problem list updated.   Objective  Blood pressure 126/76, weight 179 lb (81.2 kg), last menstrual period 04/25/2017. Pregravid weight 180 lb (81.6 kg) Total Weight Gain  (-0.454 kg) Urinalysis: Urine Protein: Negative Urine Glucose: Negative  Fetal Status: Fetal Heart Rate (bpm): 172         General:  Alert, oriented and cooperative. Patient is in no acute distress.  Skin: Skin is warm and dry. No rash noted.   Cardiovascular: Normal heart rate noted  Respiratory: Normal respiratory effort, no problems with respiration noted  Abdomen: Soft, gravid, appropriate for gestational age. Pain/Pressure: Absent     Pelvic:  Cervical exam deferred        Extremities: Normal range of motion.     Mental Status: Normal mood and affect. Normal behavior. Normal judgment and thought content.     Assessment   22 y.o. G1P0000 at 4559w0d, EDD 01/30/2018 by Last Menstrual Period presenting for routine prenatal visit.  Plan   FIRST Problems (from 06/07/17 to present)    Problem Noted Resolved   GBS bacteriuria 06/13/2017 by Oswaldo ConroySchmid, Garielle Mroz Y, CNM No   [redacted] weeks gestation  of pregnancy 06/13/2017 by Conard NovakJackson, Stephen D, MD No   Supervision of normal first pregnancy, antepartum 06/07/2017 by Oswaldo ConroySchmid, Dishawn Bhargava Y, CNM No   Overview Addendum 06/14/2017  4:40 PM by Oswaldo ConroySchmid, Rudolph Dobler Y, CNM    Clinic [facility] Prenatal Labs  Dating  Blood type: O/Positive/-- (01/14 1130)   Genetic Screen 1 Screen:    AFP:     Quad:     NIPS: Antibody:Negative (01/14 1130)  Anatomic US  Rubella: 5.78 (01/14 1130) Varicella: Immune  GTT Early: 63             Third trimester:  RPR: Non Reactive (01/14 1130)   Rhogam  HBsAg: Negative (01/14 1130)   TDaP vaccine                       Flu Shot: HIV: Non Reactive (01/14 1130)   Baby Food                                GBS: Bacteriuria in 1st trimester  Contraception  Pap:  CBB     CS/VBAC    Support Person               Unable to see NT today, advised patient to return in one week for repeat NT scan and first trimester screen.  Preterm labor symptoms and general obstetric precautions including but not limited to vaginal bleeding, contractions, and leaking of fluid were reviewed in detail with the patient.  Return in about 1 week (around 07/18/2017) for ROB with NT scan/first trimester screen.  Marcelyn BruinsJacelyn Ayodele Sangalang, CNM 07/11/2017  11:04 AM

## 2017-07-11 NOTE — Progress Notes (Signed)
No concerns.rj 

## 2017-07-18 ENCOUNTER — Encounter: Payer: Self-pay | Admitting: Advanced Practice Midwife

## 2017-07-18 ENCOUNTER — Ambulatory Visit (INDEPENDENT_AMBULATORY_CARE_PROVIDER_SITE_OTHER): Payer: Medicaid Other

## 2017-07-18 ENCOUNTER — Ambulatory Visit (INDEPENDENT_AMBULATORY_CARE_PROVIDER_SITE_OTHER): Payer: Medicaid Other | Admitting: Advanced Practice Midwife

## 2017-07-18 VITALS — BP 126/76 | Wt 176.0 lb

## 2017-07-18 DIAGNOSIS — Z1379 Encounter for other screening for genetic and chromosomal anomalies: Secondary | ICD-10-CM

## 2017-07-18 DIAGNOSIS — Z3A12 12 weeks gestation of pregnancy: Secondary | ICD-10-CM

## 2017-07-18 NOTE — Progress Notes (Signed)
  Routine Prenatal Care Visit  Subjective  Grace Griffin is a 22 y.o. G1P0000 at 872w0d being seen today for ongoing prenatal care.  She is currently monitored for the following issues for this high-risk pregnancy and has Supervision of normal first pregnancy, antepartum; BMI 31.0-31.9,adult; and GBS bacteriuria on their problem list.  ----------------------------------------------------------------------------------- Patient reports nausea. She doesn't like the way she feels on [place]. Sea Bands are helping some. Reviewed other comfort measures. Contractions: Not present. Vag. Bleeding: None.   . Denies leaking of fluid.  ----------------------------------------------------------------------------------- The following portions of the patient's history were reviewed and updated as appropriate: allergies, current medications, past family history, past medical history, past social history, past surgical history and problem list. Problem list updated.   Objective  Blood pressure 126/76, weight 176 lb (79.8 kg), last menstrual period 04/25/2017. Pregravid weight 180 lb (81.6 kg) Total Weight Gain  (-1.814 kg) Urinalysis:      Fetal Status: Fetal Heart Rate (bpm): 160         First trimester screen today: NT scan wnl  General:  Alert, oriented and cooperative. Patient is in no acute distress.  Skin: Skin is warm and dry. No rash noted.   Cardiovascular: Normal heart rate noted  Respiratory: Normal respiratory effort, no problems with respiration noted  Abdomen: Soft, gravid, appropriate for gestational age. Pain/Pressure: Absent     Pelvic:  Cervical exam deferred        Extremities: Normal range of motion.     Mental Status: Normal mood and affect. Normal behavior. Normal judgment and thought content.   Assessment   21 y.o. G1P0000 at 2172w0d by  01/30/2018, by Last Menstrual Period presenting for routine prenatal visit  Plan   FIRST Problems (from 06/07/17 to present)    Problem  Noted Resolved   GBS bacteriuria 06/13/2017 by Oswaldo ConroySchmid, Jacelyn Y, CNM No   Supervision of normal first pregnancy, antepartum 06/07/2017 by Oswaldo ConroySchmid, Jacelyn Y, CNM No   Overview Addendum 06/14/2017  4:40 PM by Oswaldo ConroySchmid, Jacelyn Y, CNM    Clinic [facility] Prenatal Labs  Dating  Blood type: O/Positive/-- (01/14 1130)   Genetic Screen 1 Screen:    AFP:     Quad:     NIPS: Antibody:Negative (01/14 1130)  Anatomic US  Rubella: 5.78 (01/14 1130) Varicella: Immune  GTT Early: 63             Third trimester:  RPR: Non Reactive (01/14 1130)   Rhogam  HBsAg: Negative (01/14 1130)   TDaP vaccine   Flu Shot: HIV: Non Reactive (01/14 1130)   Baby Food                                GBS: Bacteriuria in 1st trimester  Contraception  Pap:  CBB     CS/VBAC NA   Support Person Tristan             [redacted] weeks gestation of pregnancy 06/13/2017 by Conard NovakJackson, Stephen D, MD 07/11/2017 by Oswaldo ConroySchmid, Jacelyn Y, CNM       Preterm labor symptoms and general obstetric precautions including but not limited to vaginal bleeding, contractions, leaking of fluid and fetal movement were reviewed in detail with the patient. Please refer to After Visit Summary for other counseling recommendations.   Return in about 4 weeks (around 08/15/2017) for rob.  Tresea MallJane Skiler Tye, CNM  07/18/2017 11:52 AM

## 2017-07-18 NOTE — Progress Notes (Addendum)
ROB U/S today 

## 2017-07-18 NOTE — Patient Instructions (Signed)

## 2017-07-20 LAB — FIRST TRIMESTER SCREEN W/NT
CRL: 51 mm
DIA MoM: 0.64
DIA Value: 151.9 pg/mL
GEST AGE-COLLECT: 11.6 wk
HCG VALUE: 104.3 [IU]/mL
MATERNAL AGE AT EDD: 21.2 a
Nuchal Translucency MoM: 0.73
Nuchal Translucency: 1 mm
Number of Fetuses: 1
PAPP-A MOM: 0.89
PAPP-A VALUE: 517.1 ng/mL
TEST RESULTS: NEGATIVE
Weight: 176 [lb_av]
hCG MoM: 1.16

## 2017-08-04 ENCOUNTER — Other Ambulatory Visit: Payer: Self-pay

## 2017-08-04 ENCOUNTER — Telehealth: Payer: Self-pay

## 2017-08-04 ENCOUNTER — Emergency Department
Admission: EM | Admit: 2017-08-04 | Discharge: 2017-08-04 | Disposition: A | Discharge status: Home / Self Care | Payer: Medicaid Other | Attending: Emergency Medicine | Admitting: Emergency Medicine

## 2017-08-04 ENCOUNTER — Emergency Department: Payer: Medicaid Other

## 2017-08-04 ENCOUNTER — Encounter: Payer: Self-pay | Admitting: Emergency Medicine

## 2017-08-04 DIAGNOSIS — Z3A14 14 weeks gestation of pregnancy: Secondary | ICD-10-CM | POA: Insufficient documentation

## 2017-08-04 DIAGNOSIS — R102 Pelvic and perineal pain: Secondary | ICD-10-CM | POA: Diagnosis not present

## 2017-08-04 DIAGNOSIS — O26892 Other specified pregnancy related conditions, second trimester: Secondary | ICD-10-CM | POA: Insufficient documentation

## 2017-08-04 LAB — PREGNANCY, URINE: Preg Test, Ur: POSITIVE — AB

## 2017-08-04 LAB — COMPREHENSIVE METABOLIC PANEL
ALT: 16 U/L (ref 14–54)
ANION GAP: 10 (ref 5–15)
AST: 22 U/L (ref 15–41)
Albumin: 4.1 g/dL (ref 3.5–5.0)
Alkaline Phosphatase: 50 U/L (ref 38–126)
BILIRUBIN TOTAL: 0.6 mg/dL (ref 0.3–1.2)
BUN: 6 mg/dL (ref 6–20)
CO2: 23 mmol/L (ref 22–32)
Calcium: 9.3 mg/dL (ref 8.9–10.3)
Chloride: 104 mmol/L (ref 101–111)
Creatinine, Ser: 0.58 mg/dL (ref 0.44–1.00)
GFR calc Af Amer: >60 mL/min (ref >60)
Glucose, Bld: 88 mg/dL (ref 65–99)
POTASSIUM: 3.7 mmol/L (ref 3.5–5.1)
Sodium: 137 mmol/L (ref 135–145)
TOTAL PROTEIN: 7.2 g/dL (ref 6.5–8.1)

## 2017-08-04 LAB — CBC
HEMATOCRIT: 42.1 % (ref 35.0–47.0)
HEMOGLOBIN: 14.4 g/dL (ref 12.0–16.0)
MCH: 29.2 pg (ref 26.0–34.0)
MCHC: 34.2 g/dL (ref 32.0–36.0)
MCV: 85.4 fL (ref 80.0–100.0)
Platelets: 207 10*3/uL (ref 150–440)
RBC: 4.92 MIL/uL (ref 3.80–5.20)
RDW: 13.2 % (ref 11.5–14.5)
WBC: 12.4 10*3/uL — AB (ref 3.6–11.0)

## 2017-08-04 LAB — URINALYSIS, COMPLETE (UACMP) WITH MICROSCOPIC
BACTERIA UA: NONE SEEN
BILIRUBIN URINE: NEGATIVE
GLUCOSE, UA: NEGATIVE mg/dL
HGB URINE DIPSTICK: NEGATIVE
Ketones, ur: NEGATIVE mg/dL
LEUKOCYTES UA: NEGATIVE
NITRITE: NEGATIVE
PROTEIN: NEGATIVE mg/dL
Specific Gravity, Urine: 1.011 (ref 1.005–1.030)
pH: 8 (ref 5.0–8.0)

## 2017-08-04 LAB — HCG, QUANTITATIVE, PREGNANCY: HCG, BETA CHAIN, QUANT, S: 52596 m[IU]/mL — AB (ref <5)

## 2017-08-04 LAB — ABO/RH: ABO/RH(D): O POS

## 2017-08-04 NOTE — Telephone Encounter (Signed)
Pt reports that she went to ER for some abdominal pain/cramping last night. She had U/S and everything checked out fine. U/S Sonographer told her she was having a contraction during U/S & stated it might be a braxton hicks ctx. ER [facility] Pt concerned since she was told that. Pt advised to contact w/any further cramping, notify if spotting/bleeding. We can assess for any concerns prior to her next apt if necessary. Pt appreciative of call.

## 2017-08-04 NOTE — ED Notes (Signed)
Pt back from US

## 2017-08-04 NOTE — Telephone Encounter (Signed)
Pt requests return call. No details given. ZO#[phone]

## 2017-08-04 NOTE — ED Triage Notes (Addendum)
Patient to ER for c/o left lower quad abd/pelvic pain x2-3 days. States she thought pain would subside, but has not. Patient is currently [redacted] weeks pregnant. Denies any bleeding or discharge. 1st pregnancy. [facility] Denies any N/V, diarrhea, or fevers.

## 2017-08-04 NOTE — ED Provider Notes (Addendum)
[facility] Emergency Department Provider Note _   First MD Initiated Contact with Patient 08/04/17 0200     (approximate)  I have reviewed the triage vital signs and the nursing notes.   HISTORY  Chief Complaint Pelvic Pain   HPI Grace Griffin is a 22 y.o. female G1 P0 approximately [redacted] weeks pregnant presents to the emergency department with 2-day history of left lower abdomen/pelvic discomfort.  Patient denies any vomiting no diarrhea or constipation.  Patient denies any dysuria urgency or frequency.  Patient denies any nausea or vomiting.  Patient denies any hematuria.  No fever patient states current pain score 7 out of 10 and described as achy.  Patient denies any vaginal bleeding or discharge.   History reviewed. No pertinent past medical history.  Patient Active Problem List   Diagnosis Date Noted  . GBS bacteriuria 06/13/2017  . Supervision of normal first pregnancy, antepartum 06/07/2017  . BMI 31.0-31.9,adult 06/07/2017    Past Surgical History:  Procedure Laterality Date  . TONSILLECTOMY     AND ADENOIDECTOMY  . WISDOM TOOTH EXTRACTION      Prior to Admission medications   Medication Sig Start Date End Date Taking? Authorizing Provider  Doxylamine-Pyridoxine (DICLEGIS) 10-10 MG TBEC Take 2 tablets by mouth at bedtime. If symptoms persist, add one tablet in the morning and one in the afternoon 06/14/17   Oswaldo Conroy, CNM    Allergies No known drug allergies  Family History  Problem Relation Age of Onset  . Breast cancer Paternal Grandmother     Social History Social History   Tobacco Use  . Smoking status: Never Smoker  . Smokeless tobacco: Never Used  Substance Use Topics  . Alcohol use: No  . Drug use: No    Review of Systems Constitutional: No fever/chills Eyes: No visual changes. ENT: No sore throat. Cardiovascular: Denies chest pain. Respiratory: Denies shortness of breath. Gastrointestinal: Positive  for abdominal pain.  No nausea, no vomiting.  No diarrhea.  No constipation. Genitourinary: Negative for dysuria. Musculoskeletal: Negative for neck pain.  Negative for back pain. Integumentary: Negative for rash. Neurological: Negative for headaches, focal weakness or numbness. ___________________   PHYSICAL EXAM:  VITAL SIGNS: ED Triage Vitals  Enc Vitals Group     BP 08/04/17 0053 113/64     Pulse Rate 08/04/17 0053 73     Resp 08/04/17 0053 18     Temp 08/04/17 0053 98.1 F (36.7 C)     Temp Source 08/04/17 0053 Oral     SpO2 08/04/17 0053 100 %     Weight 08/04/17 0054 77.1 kg (170 lb)     Height 08/04/17 0054 1.6 m (5\' 3" )     Head Circumference --      Peak Flow --      Pain Score 08/04/17 0054 6     Pain Loc --      Pain Edu? --      Excl. in GC? --     Constitutional: Alert and oriented. Well appearing and in no acute distress. Eyes: Conjunctivae are normal.  Head: Atraumatic. Mouth/Throat: Mucous membranes are moist.  Oropharynx non-erythematous. Neck: No stridor.   Cardiovascular: Normal rate, regular rhythm. Good peripheral circulation. Grossly normal heart sounds. Respiratory: Normal respiratory effort.  No retractions. Lungs CTAB. Gastrointestinal: Soft and nontender. No distention.  *Musculoskeletal: No lower extremity tenderness nor edema. No gross deformities of extremities. Neurologic:  Normal speech and language. No gross focal neurologic deficits  are appreciated.  Skin:  Skin is warm, dry and intact. No rash noted. Psychiatric: Mood and affect are normal. Speech and behavior are normal.  ____________________________________________   LABS (all labs ordered are listed, but only abnormal results are displayed)  Labs Reviewed  CBC - Abnormal; Notable for the following components:      Result Value   WBC 12.4 (*)    All other components within normal limits  URINALYSIS, COMPLETE (UACMP) WITH MICROSCOPIC - Abnormal; Notable for the following  components:   Color, Urine YELLOW (*)    APPearance CLOUDY (*)    Squamous Epithelial / LPF 0-5 (*)    All other components within normal limits  HCG, QUANTITATIVE, PREGNANCY - Abnormal; Notable for the following components:   hCG, Beta Chain, Quant, S 52,596 (*)    All other components within normal limits  PREGNANCY, URINE - Abnormal; Notable for the following components:   Preg Test, Ur POSITIVE (*)    All other components within normal limits  COMPREHENSIVE METABOLIC PANEL  ABO/RH    RADIOLOGY I, [place] N Aubryana Vittorio, personally viewed and evaluated these images (plain radiographs) as part of my medical decision making, as well as reviewing the written report by the radiologist.  Official radiology report(s): US Ob Comp Less 14 Wks  Result Date: 08/04/2017 CLINICAL DATA:  Pregnant patient at 13 weeks 6 days gestation with left-sided pelvic pain for 2 days. EXAM: OBSTETRIC <14 WK ULTRASOUND TECHNIQUE: Transabdominal ultrasound was performed for evaluation of the gestation as well as the maternal uterus and adnexal regions. COMPARISON:  Obstetric ultrasound 07/18/2017 FINDINGS: Intrauterine gestational sac: Single Yolk sac:  Not Visualized, normal for gestational age. Embryo:  Visualized. Cardiac Activity: Visualized. Heart Rate: 155 bpm CRL:   83 mm   14 w 2 d                  [place] EDC: 01/31/2018 Subchorionic hemorrhage:  None visualized. Maternal uterus/adnexae: The left ovary is normal in size measuring 3.8 x 1.6 x 2.5 cm with blood flow. The right ovary is not visualized. No adnexal mass. No pelvic free fluid. Rounded echogenic structure anteriorly in beginning of the exam was not seen on later images and represented a contraction. IMPRESSION: 1. Single live intrauterine pregnancy estimated gestational age [redacted] weeks 2 days based on crown-rump length for estimated date of delivery 01/31/2018. No subchorionic hemorrhage. 2. Normal sonographic appearance of the left ovary. Electronically Signed    By: Rubye Oaks M.D.   On: 08/04/2017 03:39     Procedures   ____________________________________________   INITIAL IMPRESSION / ASSESSMENT AND PLAN / ED COURSE  As part of my medical decision making, I reviewed the following data within the electronic MEDICAL RECORD NUMBER   22 year old female presenting to the emergency department above-stated history physical exam secondary to left pelvic discomfort.  Consider the possibility of ectopic pregnancy however patient does receive an ultrasound in January which revealed an intrauterine pregnancy with no evidence of an ectopic.  Also consider possibility of a subchorionic or other potential pregnancy related etiology as such as ultrasound was performed which revealed no acute pathology single live intrauterine pregnancy at 14 weeks noted.  Consider the possibility of a left ovarian cyst none was noted on ultrasound.  Patient does have a history of a previous kidney stone and as such this was also considered given location and nature of the pain.  After ultrasound patient was reevaluated patient was pain-free.  Urinalysis revealed no evidence  of hematuria.  Spoke with the patient at length regarding warning signs that would warrant immediate return to the emergency department.    ____________________________________________  FINAL CLINICAL IMPRESSION(S) / ED DIAGNOSES  Final diagnoses:  Pelvic pain affecting pregnancy in second trimester, antepartum     MEDICATIONS GIVEN DURING THIS VISIT:  Medications - No data to display   ED Discharge Orders    None       Note:  This document was prepared using Dragon voice recognition software and may include unintentional dictation errors.    Darci CurrentBrown, [place] N, MD 08/04/17 [id number]    Darci CurrentBrown, Clarence N, MD 08/04/17 [phone]

## 2017-08-15 ENCOUNTER — Ambulatory Visit (INDEPENDENT_AMBULATORY_CARE_PROVIDER_SITE_OTHER): Payer: Medicaid Other | Admitting: Obstetrics and Gynecology

## 2017-08-15 ENCOUNTER — Encounter: Payer: Self-pay | Admitting: Obstetrics and Gynecology

## 2017-08-15 VITALS — BP 122/74 | Wt 178.0 lb

## 2017-08-15 DIAGNOSIS — R8271 Bacteriuria: Secondary | ICD-10-CM

## 2017-08-15 DIAGNOSIS — Z3400 Encounter for supervision of normal first pregnancy, unspecified trimester: Secondary | ICD-10-CM

## 2017-08-15 DIAGNOSIS — Z3A16 16 weeks gestation of pregnancy: Secondary | ICD-10-CM

## 2017-08-15 DIAGNOSIS — Z6831 Body mass index (BMI) 31.0-31.9, adult: Secondary | ICD-10-CM

## 2017-08-15 NOTE — Progress Notes (Signed)
Routine Prenatal Care Visit  Subjective  Grace Griffin is a 22 y.o. G1P0000 at 7177w0d being seen today for ongoing prenatal care.  She is currently monitored for the following issues for this low-risk pregnancy and has Supervision of normal first pregnancy, antepartum; BMI 31.0-31.9,adult; and GBS bacteriuria on their problem list.  ----------------------------------------------------------------------------------- Patient reports no complaints.   Contractions: Not present. Vag. Bleeding: None.  Movement: Absent. Denies leaking of fluid.  ----------------------------------------------------------------------------------- The following portions of the patient's history were reviewed and updated as appropriate: allergies, current medications, past family history, past medical history, past social history, past surgical history and problem list. Problem list updated.   Objective  Blood pressure 122/74, weight 178 lb (80.7 kg), last menstrual period 04/25/2017. Pregravid weight 180 lb (81.6 kg) Total Weight Gain  (-0.907 kg) Urinalysis: Urine Protein: Negative Urine Glucose: Negative  Fetal Status: Fetal Heart Rate (bpm): 150   Movement: Absent     General:  Alert, oriented and cooperative. Patient is in no acute distress.  Skin: Skin is warm and dry. No rash noted.   Cardiovascular: Normal heart rate noted  Respiratory: Normal respiratory effort, no problems with respiration noted  Abdomen: Soft, gravid, appropriate for gestational age. Pain/Pressure: Absent     Pelvic:  Cervical exam deferred        Extremities: Normal range of motion.     Mental Status: Normal mood and affect. Normal behavior. Normal judgment and thought content.   Assessment   22 y.o. G1P0000 at 1977w0d by  01/30/2018, by Last Menstrual Period presenting for routine prenatal visit  Plan   FIRST Problems (from 06/07/17 to present)    Problem Noted Resolved   GBS bacteriuria 06/13/2017 by Oswaldo ConroySchmid, Jacelyn Y, CNM No    Supervision of normal first pregnancy, antepartum 06/07/2017 by Oswaldo ConroySchmid, Jacelyn Y, CNM No   Overview Addendum 06/14/2017  4:40 PM by Oswaldo ConroySchmid, Jacelyn Y, CNM    Clinic [facility] Prenatal Labs  Dating  Blood type: O/Positive/-- (01/14 1130)   Genetic Screen 1 Screen:    AFP:     Quad:     NIPS: Antibody:Negative (01/14 1130)  Anatomic US  Rubella: 5.78 (01/14 1130) Varicella: Immune  GTT Early: 63             Third trimester:  RPR: Non Reactive (01/14 1130)   Rhogam  HBsAg: Negative (01/14 1130)   TDaP vaccine                       Flu Shot: HIV: Non Reactive (01/14 1130)   Baby Food                                GBS: Bacteriuria in 1st trimester  Contraception  Pap:  CBB     CS/VBAC    Support Person              [redacted] weeks gestation of pregnancy 06/13/2017 by Conard NovakJackson, Davionne Dowty D, MD 07/11/2017 by Oswaldo ConroySchmid, Jacelyn Y, CNM       Preterm labor symptoms and general obstetric precautions including but not limited to vaginal bleeding, contractions, leaking of fluid and fetal movement were reviewed in detail with the patient. Please refer to After Visit Summary for other counseling recommendations.   Return in about 4 weeks (around 09/12/2017) for schedule u/s for anatomy and routine prenatal.  Thomasene MohairStephen Henli Hey, MD, Merlinda FrederickFACOG [facility], [facility] 08/15/2017 9:39  AM

## 2017-08-15 NOTE — Patient Instructions (Signed)
Second Trimester of Pregnancy The second trimester is from week 13 through week 28, month 4 through 6. This is often the time in pregnancy that you feel your best. Often times, morning sickness has lessened or quit. You may have more energy, and you may get hungry more often. Your unborn baby (fetus) is growing rapidly. At the end of the sixth month, he or she is about 9 inches long and weighs about 1 pounds. You will likely feel the baby move (quickening) between 18 and 20 weeks of pregnancy. Follow these instructions at home:  Avoid all smoking, herbs, and alcohol. Avoid drugs not approved by your doctor.  Do not use any tobacco products, including cigarettes, chewing tobacco, and electronic cigarettes. If you need help quitting, ask your doctor. You may get counseling or other support to help you quit.  Only take medicine as told by your doctor. Some medicines are safe and some are not during pregnancy.  Exercise only as told by your doctor. Stop exercising if you start having cramps.  Eat regular, healthy meals.  Wear a good support bra if your breasts are tender.  Do not use hot tubs, steam rooms, or saunas.  Wear your seat belt when driving.  Avoid raw meat, uncooked cheese, and liter boxes and soil used by cats.  Take your prenatal vitamins.  Take 1500-2000 milligrams of calcium daily starting at the 20th week of pregnancy until you deliver your baby.  Try taking medicine that helps you poop (stool softener) as needed, and if your doctor approves. Eat more fiber by eating fresh fruit, vegetables, and whole grains. Drink enough fluids to keep your pee (urine) clear or pale yellow.  Take warm water baths (sitz baths) to soothe pain or discomfort caused by hemorrhoids. Use hemorrhoid cream if your doctor approves.  If you have puffy, bulging veins (varicose veins), wear support hose. Raise (elevate) your feet for 15 minutes, 3-4 times a day. Limit salt in your diet.  Avoid heavy  lifting, wear low heals, and sit up straight.  Rest with your legs raised if you have leg cramps or low back pain.  Visit your dentist if you have not gone during your pregnancy. Use a soft toothbrush to brush your teeth. Be gentle when you floss.  You can have sex (intercourse) unless your doctor tells you not to.  Go to your doctor visits. Get help if:  You feel dizzy.  You have mild cramps or pressure in your lower belly (abdomen).  You have a nagging pain in your belly area.  You continue to feel sick to your stomach (nauseous), throw up (vomit), or have watery poop (diarrhea).  You have bad smelling fluid coming from your vagina.  You have pain with peeing (urination). Get help right away if:  You have a fever.  You are leaking fluid from your vagina.  You have spotting or bleeding from your vagina.  You have severe belly cramping or pain.  You lose or gain weight rapidly.  You have trouble catching your breath and have chest pain.  You notice sudden or extreme puffiness (swelling) of your face, hands, ankles, feet, or legs.  You have not felt the baby move in over an hour.  You have severe headaches that do not go away with medicine.  You have vision changes. This information is not intended to replace advice given to you by your health care provider. Make sure you discuss any questions you have with your health care   provider. Document Released: 08/11/2009 Document Revised: 10/23/2015 Document Reviewed: 07/18/2012 Elsevier Interactive Patient Education  2017 [employer]  

## 2017-09-12 ENCOUNTER — Encounter: Payer: Self-pay | Admitting: Maternal Newborn

## 2017-09-12 ENCOUNTER — Ambulatory Visit (INDEPENDENT_AMBULATORY_CARE_PROVIDER_SITE_OTHER): Payer: Medicaid Other

## 2017-09-12 ENCOUNTER — Ambulatory Visit (INDEPENDENT_AMBULATORY_CARE_PROVIDER_SITE_OTHER): Payer: Medicaid Other | Admitting: Maternal Newborn

## 2017-09-12 VITALS — BP 120/80 | Wt 181.0 lb

## 2017-09-12 DIAGNOSIS — Z3400 Encounter for supervision of normal first pregnancy, unspecified trimester: Secondary | ICD-10-CM | POA: Diagnosis not present

## 2017-09-12 DIAGNOSIS — Z3689 Encounter for other specified antenatal screening: Secondary | ICD-10-CM

## 2017-09-12 NOTE — Progress Notes (Signed)
No concerns, rj 

## 2017-09-12 NOTE — Progress Notes (Signed)
    Routine Prenatal Care Visit  Subjective  Grace Griffin is a 22 y.o. G1P0000 at 713w0d being seen today for ongoing prenatal care.  She is currently monitored for the following issues for this low-risk pregnancy and has Supervision of normal first pregnancy, antepartum; BMI 31.0-31.9,adult; and GBS bacteriuria on their problem list.  ----------------------------------------------------------------------------------- Patient reports no complaints.   Contractions: Not present. Vag. Bleeding: None.  Movement: Present. Denies leaking of fluid.  ----------------------------------------------------------------------------------- The following portions of the patient's history were reviewed and updated as appropriate: allergies, current medications, past family history, past medical history, past social history, past surgical history and problem list. Problem list updated.   Objective   Vitals:   09/12/17 1007  BP: 120/80   Last menstrual period 04/25/2017. Pregravid weight 180 lb (81.6 kg) Total Weight Gain 1 lb (0.454 kg) Urinalysis: Urine Protein: Negative Urine Glucose: Negative  Fetal Status: Fetal Heart Rate (bpm): 162   Movement: Present     General:  Alert, oriented and cooperative. Patient is in no acute distress.  Skin: Skin is warm and dry. No rash noted.   Cardiovascular: Normal heart rate noted  Respiratory: Normal respiratory effort, no problems with respiration noted  Abdomen: Soft, gravid, appropriate for gestational age. Pain/Pressure: Absent     Pelvic:  Cervical exam deferred        Extremities: Normal range of motion.     Mental Status: Normal mood and affect. Normal behavior. Normal judgment and thought content.     Assessment   22 y.o. G1P0000 at 343w0d, EDD 01/30/2018 by Last Menstrual Period presenting for routine prenatal visit.  Plan   FIRST Problems (from 06/07/17 to present)    Problem Noted Resolved   GBS bacteriuria 06/13/2017 by Oswaldo ConroySchmid, Vance Belcourt Y,  CNM No   Supervision of normal first pregnancy, antepartum 06/07/2017 by Oswaldo ConroySchmid, Audriella Blakeley Y, CNM No   Overview Addendum 06/14/2017  4:40 PM by Oswaldo ConroySchmid, Wylie Coon Y, CNM    Clinic [facility] Prenatal Labs  Dating  Blood type: O/Positive/-- (01/14 1130)   Genetic Screen 1 Screen:    AFP:     Quad:     NIPS: Antibody:Negative (01/14 1130)  Anatomic US  Rubella: 5.78 (01/14 1130) Varicella: Immune  GTT Early: 63             Third trimester:  RPR: Non Reactive (01/14 1130)   Rhogam  HBsAg: Negative (01/14 1130)   TDaP vaccine                       Flu Shot: HIV: Non Reactive (01/14 1130)   Baby Food                                GBS: Bacteriuria in 1st trimester  Contraception  Pap:  CBB     CS/VBAC    Support Person              [redacted] weeks gestation of pregnancy 06/13/2017 by Conard NovakJackson, Stephen D, MD 07/11/2017 by Oswaldo ConroySchmid, Larell Baney Y, CNM      Anatomy scan today incomplete for RVOT and suboptimal views of nose/lips. Repeat in 4 weeks.  Gestational age appropriate obstetric precautions were reviewed.  Return in about 1 month (around 10/10/2017) for ROB and follow-up anatomy scan.  Grace Griffin, CNM 09/12/2017  10:48 AM

## 2017-10-10 ENCOUNTER — Ambulatory Visit (INDEPENDENT_AMBULATORY_CARE_PROVIDER_SITE_OTHER): Payer: Medicaid Other

## 2017-10-10 ENCOUNTER — Encounter: Payer: Self-pay | Admitting: Obstetrics and Gynecology

## 2017-10-10 ENCOUNTER — Telehealth: Payer: Self-pay

## 2017-10-10 ENCOUNTER — Ambulatory Visit (INDEPENDENT_AMBULATORY_CARE_PROVIDER_SITE_OTHER): Payer: Medicaid Other | Admitting: Obstetrics and Gynecology

## 2017-10-10 VITALS — BP 118/56 | Wt 196.0 lb

## 2017-10-10 DIAGNOSIS — Z3689 Encounter for other specified antenatal screening: Secondary | ICD-10-CM

## 2017-10-10 DIAGNOSIS — Z3A24 24 weeks gestation of pregnancy: Secondary | ICD-10-CM

## 2017-10-10 DIAGNOSIS — K219 Gastro-esophageal reflux disease without esophagitis: Secondary | ICD-10-CM

## 2017-10-10 DIAGNOSIS — Z3400 Encounter for supervision of normal first pregnancy, unspecified trimester: Secondary | ICD-10-CM

## 2017-10-10 DIAGNOSIS — Z6834 Body mass index (BMI) 34.0-34.9, adult: Secondary | ICD-10-CM

## 2017-10-10 MED ORDER — RANITIDINE HCL 150 MG PO TABS: 150.0000 mg | ORAL_TABLET | Freq: Two times a day (BID) | ORAL | 11 refills | Status: DC

## 2017-10-10 NOTE — Progress Notes (Signed)
Routine Prenatal Care Visit  Subjective  Grace Griffin is a 22 y.o. G1P0000 at [redacted]w[redacted]d being seen today for ongoing prenatal care.  She is currently monitored for the following issues for this low-risk pregnancy and has Supervision of normal first pregnancy, antepartum; BMI 34.0-34.9,adult; and GBS bacteriuria on their problem list.  ----------------------------------------------------------------------------------- Patient reports worsening reflux and burning pain at her belly button.   Contractions: Not present. Vag. Bleeding: None.  Movement: Present. Denies leaking of fluid.  ----------------------------------------------------------------------------------- The following portions of the patient's history were reviewed and updated as appropriate: allergies, current medications, past family history, past medical history, past social history, past surgical history and problem list. Problem list updated.   Objective  Blood pressure (!) 118/56, weight 196 lb (88.9 kg), last menstrual period 04/25/2017. Pregravid weight 180 lb (81.6 kg) Total Weight Gain 16 lb (7.258 kg) Urinalysis: Urine Protein: Negative Urine Glucose: Negative  Fetal Status: Fetal Heart Rate (bpm): 145   Movement: Present     General:  Alert, oriented and cooperative. Patient is in no acute distress.  Skin: Skin is warm and dry. No rash noted.   Cardiovascular: Normal heart rate noted  Respiratory: Normal respiratory effort, no problems with respiration noted  Abdomen: Soft, gravid, appropriate for gestational age. Pain/Pressure: Absent     Pelvic:  Cervical exam deferred        Extremities: Normal range of motion.     Mental Status: Normal mood and affect. Normal behavior. Normal judgment and thought content.   US Ob Follow Up  Result Date: 10/10/2017 ULTRASOUND REPORT Patient Name: Grace P Geisinger DOB: [birth year] MRN: [id number] Location: [facility] Date of Service: 10/10/2017 Indications:F/U Anatomy  Findings: Mason Jim intrauterine pregnancy is visualized with FHR at 155 bpm. Fetal presentation is Cephalic. Placenta: Posterior, grade 0. AFI: subjectively normal. Anatomic survey is complete,  although RVOT is suboptimal. Impression: 1. [redacted]w[redacted]d Viable Singleton Intrauterine pregnancy previously established criteria. 2. Normal Anatomy Scan is now complete, although RVOT is suboptimal. Recommendations: 1.Clinical correlation with the patient's History and Physical Exam. Willette Alma, RDMS, RVT I have reviewed this ultrasound and the report. I agree with the above assessment and plan. Adelene Idler MD [facility] 10/10/17 10:13 AM      Assessment   21 y.o. G1P0000 at [redacted]w[redacted]d by  01/30/2018, by Last Menstrual Period presenting for routine prenatal visit  Plan   FIRST Problems (from 06/07/17 to present)    Problem Noted Resolved   GBS bacteriuria 06/13/2017 by Oswaldo Conroy, CNM No   Supervision of normal first pregnancy, antepartum 06/07/2017 by Oswaldo Conroy, CNM No   Overview Addendum 10/10/2017 10:43 AM by Natale Milch, MD    Clinic [facility] Prenatal Labs  Dating  LMP = 6wk [place] Blood type: O/Positive/-- (01/14 1130)   Genetic Screen 1 Screen: Negative Antibody:Negative (01/14 1130)  Anatomic [place] complete Rubella: 5.78 (01/14 1130) Varicella: Immune  GTT Early: 63             Third trimester:  RPR: Non Reactive (01/14 1130)   Rhogam  not needed HBsAg: Negative (01/14 1130)   TDaP vaccine                        Flu Shot: HIV: Non Reactive (01/14 1130)   Baby Food   Breast  GBS: Bacteriuria in 1st trimester  Contraception Given information Pap: Under 21  CBB  Given Information   CS/VBAC Not applicable   Support Person              [redacted] weeks gestation of pregnancy 06/13/2017 by Conard Novak, MD 07/11/2017 by Oswaldo Conroy, CNM       Gestational age appropriate obstetric precautions including but not  limited to vaginal bleeding, contractions, leaking of fluid and fetal movement were reviewed in detail with the patient.    Follow up anatomy US normal. Will start Zantac for GERD. Given information on prenatal classes with [facility].  Given information on birthcontrol options postpartum. Recommended [email or web address].  Given information on cord blood banking. Discussed breast feeding. Patient is planning on breast feeding. Patient was given resources and lactation consultation was advised.   Return in about 1 month (around 11/07/2017) for ROB, 28 week labs.  Adelene Idler MD [facility], [facility] 10/10/17 10:44 AM

## 2017-10-10 NOTE — Telephone Encounter (Signed)
Pt returning missed call. 

## 2017-10-10 NOTE — Progress Notes (Signed)
ROB U/S today 

## 2017-10-11 NOTE — Telephone Encounter (Signed)
MyChart message sent to patient.

## 2017-10-22 ENCOUNTER — Encounter: Payer: Self-pay | Admitting: Obstetrics and Gynecology

## 2017-11-07 ENCOUNTER — Encounter: Payer: Self-pay | Admitting: Maternal Newborn

## 2017-11-07 ENCOUNTER — Other Ambulatory Visit: Payer: Medicaid Other

## 2017-11-07 ENCOUNTER — Ambulatory Visit (INDEPENDENT_AMBULATORY_CARE_PROVIDER_SITE_OTHER): Payer: Medicaid Other | Admitting: Maternal Newborn

## 2017-11-07 VITALS — BP 110/60 | Wt 204.0 lb

## 2017-11-07 DIAGNOSIS — Z3400 Encounter for supervision of normal first pregnancy, unspecified trimester: Secondary | ICD-10-CM

## 2017-11-07 DIAGNOSIS — M545 Low back pain, unspecified: Secondary | ICD-10-CM

## 2017-11-07 DIAGNOSIS — Z3A28 28 weeks gestation of pregnancy: Secondary | ICD-10-CM

## 2017-11-07 NOTE — Progress Notes (Signed)
C/O A lot of back problems lately.rj

## 2017-11-07 NOTE — Progress Notes (Signed)
Routine Prenatal Care Visit  Subjective  Grace Griffin is a 22 y.o. G1P0000 at 8969w0d being seen today for ongoing prenatal care.  She is currently monitored for the following issues for this low-risk pregnancy and has Supervision of normal first pregnancy, antepartum; BMI 34.0-34.9,adult; and GBS bacteriuria on their problem list.  ----------------------------------------------------------------------------------- Patient reports backache. She recently woke up with what felt like a muscle spasm on the right side that lasted about 5 minutes before it resolved.  Contractions: Not present. Vag. Bleeding: None.  Movement: Present. No leaking of fluid.  ----------------------------------------------------------------------------------- The following portions of the patient's history were reviewed and updated as appropriate: allergies, current medications, past family history, past medical history, past social history, past surgical history and problem list. Problem list updated.   Objective  Blood pressure 110/60, weight 204 lb (92.5 kg), last menstrual period 04/25/2017. Pregravid weight 180 lb (81.6 kg) Total Weight Gain 24 lb (10.9 kg) Urinalysis: Urine Protein: Negative Urine Glucose: Negative  Fetal Status: Fetal Heart Rate (bpm): 138   Movement: Present     General:  Alert, oriented and cooperative. Patient is in no acute distress.  Skin: Skin is warm and dry. No rash noted.   Cardiovascular: Normal heart rate noted  Respiratory: Normal respiratory effort, no problems with respiration noted  Abdomen: Soft, gravid, appropriate for gestational age. Pain/Pressure: Absent     Pelvic:  Cervical exam deferred        Extremities: Normal range of motion.     Mental Status: Normal mood and affect. Normal behavior. Normal judgment and thought content.     Assessment   21 y.o. G1P0000 at 4069w0d, EDD 01/30/2018 by Last Menstrual Period presenting for routine prenatal visit.  Plan    FIRST Problems (from 06/07/17 to present)    Problem Noted Resolved   GBS bacteriuria 06/13/2017 by Oswaldo ConroySchmid, Tanny Harnack Y, CNM No   Supervision of normal first pregnancy, antepartum 06/07/2017 by Oswaldo ConroySchmid, Adra Shepler Y, CNM No   Overview Addendum 10/10/2017 10:43 AM by Natale MilchSchuman, Christanna R, MD    Clinic [facility] Prenatal Labs  Dating  LMP = 6wk US Blood type: O/Positive/-- (01/14 1130)   Genetic Screen 1 Screen: Negative Antibody:Negative (01/14 1130)  Anatomic US complete Rubella: 5.78 (01/14 1130) Varicella: Immune  GTT Early: 63             Third trimester:  RPR: Non Reactive (01/14 1130)   Rhogam  not needed HBsAg: Negative (01/14 1130)   TDaP vaccine                        Flu Shot: HIV: Non Reactive (01/14 1130)   Baby Food   Breast                             GBS: Bacteriuria in 1st trimester  Contraception Given information Pap: Under 21  CBB  Given Information   CS/VBAC Not applicable   Support Person              [redacted] weeks gestation of pregnancy 06/13/2017 by Conard NovakJackson, Stephen D, MD 07/11/2017 by Oswaldo ConroySchmid, Nakeia Calvi Y, CNM    Send urine culture for history of frequent UTI without dysuria. If negative, Rx for cyclobenzaprine for muscle spasm recurrence.  GTT/ 28 week labs today.   Please refer to After Visit Summary for other counseling recommendations.   Return in about 2 weeks (around 11/21/2017) for ROB.  Marcelyn Bruins, CNM 11/07/2017  10:55 AM

## 2017-11-07 NOTE — Patient Instructions (Signed)
Third Trimester of Pregnancy The third trimester is from week 28 through week 40 (months 7 through 9). The third trimester is a time when the unborn baby (fetus) is growing rapidly. At the end of the ninth month, the fetus is about 20 inches in length and weighs 6-10 pounds. Body changes during your third trimester Your body will continue to go through many changes during pregnancy. The changes vary from woman to woman. During the third trimester:  Your weight will continue to increase. You can expect to gain 25-35 pounds (11-16 kg) by the end of the pregnancy.  You may begin to get stretch marks on your hips, abdomen, and breasts.  You may urinate more often because the fetus is moving lower into your pelvis and pressing on your bladder.  You may develop or continue to have heartburn. This is caused by increased hormones that slow down muscles in the digestive tract.  You may develop or continue to have constipation because increased hormones slow digestion and cause the muscles that push waste through your intestines to relax.  You may develop hemorrhoids. These are swollen veins (varicose veins) in the rectum that can itch or be painful.  You may develop swollen, bulging veins (varicose veins) in your legs.  You may have increased body aches in the pelvis, back, or thighs. This is due to weight gain and increased hormones that are relaxing your joints.  You may have changes in your hair. These can include thickening of your hair, rapid growth, and changes in texture. Some women also have hair loss during or after pregnancy, or hair that feels dry or thin. Your hair will most likely return to normal after your baby is born.  Your breasts will continue to grow and they will continue to become tender. A yellow fluid (colostrum) may leak from your breasts. This is the first milk you are producing for your baby.  Your belly button may stick out.  You may notice more swelling in your hands,  face, or ankles.  You may have increased tingling or numbness in your hands, arms, and legs. The skin on your belly may also feel numb.  You may feel short of breath because of your expanding uterus.  You may have more problems sleeping. This can be caused by the size of your belly, increased need to urinate, and an increase in your body's metabolism.  You may notice the fetus "dropping," or moving lower in your abdomen (lightening).  You may have increased vaginal discharge.  You may notice your joints feel loose and you may have pain around your pelvic bone.  What to expect at prenatal visits You will have prenatal exams every 2 weeks until week 36. Then you will have weekly prenatal exams. During a routine prenatal visit:  You will be weighed to make sure you and the baby are growing normally.  Your blood pressure will be taken.  Your abdomen will be measured to track your baby's growth.  The fetal heartbeat will be listened to.  Any test results from the previous visit will be discussed.  You may have a cervical check near your due date to see if your cervix has softened or thinned (effaced).  You will be tested for Group B streptococcus. This happens between 35 and 37 weeks.  Your health care provider may ask you:  What your birth plan is.  How you are feeling.  If you are feeling the baby move.  If you have had   any abnormal symptoms, such as leaking fluid, bleeding, severe headaches, or abdominal cramping.  If you are using any tobacco products, including cigarettes, chewing tobacco, and electronic cigarettes.  If you have any questions.  Other tests or screenings that may be performed during your third trimester include:  Blood tests that check for low iron levels (anemia).  Fetal testing to check the health, activity level, and growth of the fetus. Testing is done if you have certain medical conditions or if there are problems during the  pregnancy.  Nonstress test (NST). This test checks the health of your baby to make sure there are no signs of problems, such as the baby not getting enough oxygen. During this test, a belt is placed around your belly. The baby is made to move, and its heart rate is monitored during movement.  What is false labor? False labor is a condition in which you feel small, irregular tightenings of the muscles in the womb (contractions) that usually go away with rest, changing position, or drinking water. These are called Braxton Hicks contractions. Contractions may last for hours, days, or even weeks before true labor sets in. If contractions come at regular intervals, become more frequent, increase in intensity, or become painful, you should see your health care provider. What are the signs of labor?  Abdominal cramps.  Regular contractions that start at 10 minutes apart and become stronger and more frequent with time.  Contractions that start on the top of the uterus and spread down to the lower abdomen and back.  Increased pelvic pressure and dull back pain.  A watery or bloody mucus discharge that comes from the vagina.  Leaking of amniotic fluid. This is also known as your "water breaking." It could be a slow trickle or a gush. Let your health care provider know if it has a color or strange odor. If you have any of these signs, call your health care provider right away, even if it is before your due date. Follow these instructions at home: Medicines  Follow your health care provider's instructions regarding medicine use. Specific medicines may be either safe or unsafe to take during pregnancy.  Take a prenatal vitamin that contains at least 600 micrograms (mcg) of folic acid.  If you develop constipation, try taking a stool softener if your health care provider approves. Eating and drinking  Eat a balanced diet that includes fresh fruits and vegetables, whole grains, good sources of protein  such as meat, eggs, or tofu, and low-fat dairy. Your health care provider will help you determine the amount of weight gain that is right for you.  Avoid raw meat and uncooked cheese. These carry germs that can cause birth defects in the baby.  If you have low calcium intake from food, talk to your health care provider about whether you should take a daily calcium supplement.  Eat four or five small meals rather than three large meals a day.  Limit foods that are high in fat and processed sugars, such as fried and sweet foods.  To prevent constipation: ? Drink enough fluid to keep your urine clear or pale yellow. ? Eat foods that are high in fiber, such as fresh fruits and vegetables, whole grains, and beans. Activity  Exercise only as directed by your health care provider. Most women can continue their usual exercise routine during pregnancy. Try to exercise for 30 minutes at least 5 days a week. Stop exercising if you experience uterine contractions.  Avoid heavy   lifting.  Do not exercise in extreme heat or humidity, or at high altitudes.  Wear low-heel, comfortable shoes.  Practice good posture.  You may continue to have sex unless your health care provider tells you otherwise. Relieving pain and discomfort  Take frequent breaks and rest with your legs elevated if you have leg cramps or low back pain.  Take warm sitz baths to soothe any pain or discomfort caused by hemorrhoids. Use hemorrhoid cream if your health care provider approves.  Wear a good support bra to prevent discomfort from breast tenderness.  If you develop varicose veins: ? Wear support pantyhose or compression stockings as told by your healthcare provider. ? Elevate your feet for 15 minutes, 3-4 times a day. Prenatal care  Write down your questions. Take them to your prenatal visits.  Keep all your prenatal visits as told by your health care provider. This is important. Safety  Wear your seat belt at  all times when driving.  Make a list of emergency phone numbers, including numbers for family, friends, the hospital, and police and fire departments. General instructions  Avoid cat litter boxes and soil used by cats. These carry germs that can cause birth defects in the baby. If you have a cat, ask someone to clean the litter box for you.  Do not travel far distances unless it is absolutely necessary and only with the approval of your health care provider.  Do not use hot tubs, steam rooms, or saunas.  Do not drink alcohol.  Do not use any products that contain nicotine or tobacco, such as cigarettes and e-cigarettes. If you need help quitting, ask your health care provider.  Do not use any medicinal herbs or unprescribed drugs. These chemicals affect the formation and growth of the baby.  Do not douche or use tampons or scented sanitary pads.  Do not cross your legs for long periods of time.  To prepare for the arrival of your baby: ? Take prenatal classes to understand, practice, and ask questions about labor and delivery. ? Make a trial run to the hospital. ? Visit the hospital and tour the maternity area. ? Arrange for maternity or paternity leave through employers. ? Arrange for family and friends to take care of pets while you are in the hospital. ? Purchase a rear-facing car seat and make sure you know how to install it in your car. ? Pack your hospital bag. ? Prepare the baby's nursery. Make sure to remove all pillows and stuffed animals from the baby's crib to prevent suffocation.  Visit your dentist if you have not gone during your pregnancy. Use a soft toothbrush to brush your teeth and be gentle when you floss. Contact a health care provider if:  You are unsure if you are in labor or if your water has broken.  You become dizzy.  You have mild pelvic cramps, pelvic pressure, or nagging pain in your abdominal area.  You have lower back pain.  You have persistent  nausea, vomiting, or diarrhea.  You have an unusual or bad smelling vaginal discharge.  You have pain when you urinate. Get help right away if:  Your water breaks before 37 weeks.  You have regular contractions less than 5 minutes apart before 37 weeks.  You have a fever.  You are leaking fluid from your vagina.  You have spotting or bleeding from your vagina.  You have severe abdominal pain or cramping.  You have rapid weight loss or weight gain.    You have shortness of breath with chest pain.  You notice sudden or extreme swelling of your face, hands, ankles, feet, or legs.  Your baby makes fewer than 10 movements in 2 hours.  You have severe headaches that do not go away when you take medicine.  You have vision changes. Summary  The third trimester is from week 28 through week 40, months 7 through 9. The third trimester is a time when the unborn baby (fetus) is growing rapidly.  During the third trimester, your discomfort may increase as you and your baby continue to gain weight. You may have abdominal, leg, and back pain, sleeping problems, and an increased need to urinate.  During the third trimester your breasts will keep growing and they will continue to become tender. A yellow fluid (colostrum) may leak from your breasts. This is the first milk you are producing for your baby.  False labor is a condition in which you feel small, irregular tightenings of the muscles in the womb (contractions) that eventually go away. These are called Braxton Hicks contractions. Contractions may last for hours, days, or even weeks before true labor sets in.  Signs of labor can include: abdominal cramps; regular contractions that start at 10 minutes apart and become stronger and more frequent with time; watery or bloody mucus discharge that comes from the vagina; increased pelvic pressure and dull back pain; and leaking of amniotic fluid. This information is not intended to replace advice  given to you by your health care provider. Make sure you discuss any questions you have with your health care provider. Document Released: 05/11/2001 Document Revised: 10/23/2015 Document Reviewed: 07/18/2012 Elsevier Interactive Patient Education  2017 [employer]  

## 2017-11-08 ENCOUNTER — Telehealth: Payer: Self-pay

## 2017-11-08 ENCOUNTER — Other Ambulatory Visit: Payer: Self-pay | Admitting: Obstetrics and Gynecology

## 2017-11-08 DIAGNOSIS — R7309 Other abnormal glucose: Secondary | ICD-10-CM

## 2017-11-08 LAB — IMMATURE CELLS
METAMYELOCYTES: 4 % — AB (ref 0–0)
MYELOCYTES: 1 % — AB (ref 0–0)

## 2017-11-08 LAB — 28 WEEK RH+PANEL
Basophils Absolute: 0.1 10*3/uL (ref 0.0–0.2)
Basos: 1 %
EOS (ABSOLUTE): 0 10*3/uL (ref 0.0–0.4)
Eos: 0 %
GESTATIONAL DIABETES SCREEN: 160 mg/dL — AB (ref 65–139)
HEMATOCRIT: 35.3 % (ref 34.0–46.6)
HEMOGLOBIN: 11.6 g/dL (ref 11.1–15.9)
HIV Screen 4th Generation wRfx: NONREACTIVE
LYMPHS ABS: 2.1 10*3/uL (ref 0.7–3.1)
Lymphs: 16 %
MCH: 28.4 pg (ref 26.6–33.0)
MCHC: 32.9 g/dL (ref 31.5–35.7)
MCV: 87 fL (ref 79–97)
MONOS ABS: 0 10*3/uL — AB (ref 0.1–0.9)
Monocytes: 0 %
NEUTROS ABS: 10.1 10*3/uL — AB (ref 1.4–7.0)
Neutrophils: 78 %
PLATELETS: 232 10*3/uL (ref 150–450)
RBC: 4.08 x10E6/uL (ref 3.77–5.28)
RDW: 13.2 % (ref 12.3–15.4)
RPR: NONREACTIVE
WBC: 13 10*3/uL — ABNORMAL HIGH (ref 3.4–10.8)

## 2017-11-08 NOTE — Telephone Encounter (Signed)
Pt states she missed a phone call from us & was returning the call.

## 2017-11-08 NOTE — Telephone Encounter (Signed)
Looks like [employer] set up her 3 hour which is what she needed.  Thanks, Dr. Jerene PitchSchuman

## 2017-11-08 NOTE — Progress Notes (Signed)
Needs 3 hr gtt. Called 11/08/17 @ 7:50am no answer, released to mychart.

## 2017-11-08 NOTE — Progress Notes (Signed)
Please contact patient about completing a 3 hour gtt. Lab is ordered.  Thank you, Dr. Jerene PitchSchuman

## 2017-11-09 LAB — URINE CULTURE

## 2017-11-10 ENCOUNTER — Other Ambulatory Visit: Payer: Self-pay

## 2017-11-10 DIAGNOSIS — R7309 Other abnormal glucose: Secondary | ICD-10-CM

## 2017-11-10 NOTE — Addendum Note (Signed)
Addended by: Reather LittlerUTHUS, Kenyia Wambolt D on: 11/10/2017 04:46 PM   Modules accepted: Orders

## 2017-11-11 ENCOUNTER — Other Ambulatory Visit: Payer: Medicaid Other

## 2017-11-11 DIAGNOSIS — R7309 Other abnormal glucose: Secondary | ICD-10-CM

## 2017-11-12 LAB — GESTATIONAL GLUCOSE TOLERANCE
GLUCOSE 3 HOUR GTT: 72 mg/dL (ref 65–139)
Glucose, Fasting: 91 mg/dL (ref 65–94)
Glucose, GTT - 1 Hour: 105 mg/dL (ref 65–179)
Glucose, GTT - 2 Hour: 71 mg/dL (ref 65–154)

## 2017-11-14 NOTE — Progress Notes (Signed)
Normal 3 hr GTT

## 2017-11-18 ENCOUNTER — Encounter: Payer: Self-pay | Admitting: Obstetrics and Gynecology

## 2017-11-18 ENCOUNTER — Ambulatory Visit (INDEPENDENT_AMBULATORY_CARE_PROVIDER_SITE_OTHER): Payer: Medicaid Other | Admitting: Obstetrics and Gynecology

## 2017-11-18 VITALS — BP 112/70 | Wt 206.0 lb

## 2017-11-18 DIAGNOSIS — Z3400 Encounter for supervision of normal first pregnancy, unspecified trimester: Secondary | ICD-10-CM

## 2017-11-18 DIAGNOSIS — O99213 Obesity complicating pregnancy, third trimester: Secondary | ICD-10-CM

## 2017-11-18 DIAGNOSIS — Z3A29 29 weeks gestation of pregnancy: Secondary | ICD-10-CM

## 2017-11-18 DIAGNOSIS — R8271 Bacteriuria: Secondary | ICD-10-CM

## 2017-11-18 DIAGNOSIS — O99210 Obesity complicating pregnancy, unspecified trimester: Secondary | ICD-10-CM | POA: Insufficient documentation

## 2017-11-18 DIAGNOSIS — Z6834 Body mass index (BMI) 34.0-34.9, adult: Secondary | ICD-10-CM

## 2017-11-18 NOTE — Progress Notes (Incomplete)
Routine Prenatal Care Visit  Subjective  Grace Griffin is a 22 y.o. G1P0000 at 8865w4d being seen today for ongoing prenatal [place].  She is currently monitored for the following issues for this {Blank single:19197::"high-risk","low-risk"} pregnancy and has Supervision of normal first pregnancy, antepartum; BMI 34.0-34.9,adult; GBS bacteriuria; and Obesity complicating pregnancy on their problem list.  ----------------------------------------------------------------------------------- Patient reports {sx:14538}.   Contractions: Not present. Vag. Bleeding: None.  Movement: Present. Denies leaking of fluid.  ----------------------------------------------------------------------------------- The following portions of the patient's history were reviewed and updated as appropriate: allergies, current medications, past family history, past medical history, past social history, past surgical history and problem list. Problem list updated.   Objective  Blood pressure 112/70, weight 206 lb (93.4 kg), last menstrual period 04/25/2017. Pregravid weight 180 lb (81.6 kg) Total Weight Gain 26 lb (11.8 kg) Urinalysis:      Fetal Status: Fetal Heart Rate (bpm): 140 Fundal Height: 30 cm Movement: Present     General:  Alert, oriented and cooperative. Patient is in no acute distress.  Skin: Skin is warm and dry. No rash noted.   Cardiovascular: Normal heart rate noted  Respiratory: Normal respiratory effort, no problems with respiration noted  Abdomen: Soft, gravid, appropriate for gestational age. Pain/Pressure: Absent     Pelvic:  {Blank single:19197::"Cervical exam performed","Cervical exam deferred"}        Extremities: Normal range of motion.  Edema: None  Mental Status: Normal mood and affect. Normal behavior. Normal judgment and thought content.   Assessment   21 y.o. G1P0000 at 9065w4d by  01/30/2018, by Last Menstrual Period presenting for {Blank single:19197::"routine","work-in"} prenatal  visit  Plan   FIRST Problems (from 06/07/17 to present)    Problem Noted Resolved   Obesity complicating pregnancy 11/18/2017 by Conard NovakJackson, Gibson Telleria D, MD No   GBS bacteriuria 06/13/2017 by Oswaldo ConroySchmid, Jacelyn Y, CNM No   Supervision of normal first pregnancy, antepartum 06/07/2017 by Oswaldo ConroySchmid, Jacelyn Y, CNM No   Overview Addendum 10/10/2017 10:43 AM by Natale MilchSchuman, Christanna R, MD    [facility]inic [facility] Prenatal Labs  Dating  LMP = 6wk US Blood type: O/Positive/-- (01/14 1130)   Genetic Screen 1 Screen: Negative Antibody:Negative (01/14 1130)  Anatomic US complete Rubella: 5.78 (01/14 1130) Varicella: Immune  GTT Early: 63             Third trimester:  RPR: Non Reactive (01/14 1130)   Rhogam  not needed HBsAg: Negative (01/14 1130)   TDaP vaccine                        Flu Shot: HIV: Non Reactive (01/14 1130)   Baby Food   Breast                             GBS: Bacteriuria in 1st trimester  Contraception Given information Pap: Under 21  CBB  Given Information   CS/VBAC Not applicable   Support Person              [redacted] weeks gestation of pregnancy 06/13/2017 by Conard NovakJackson, Anetta Olvera D, MD 07/11/2017 by Oswaldo ConroySchmid, Jacelyn Y, CNM       {Blank single:19197::"Term","Preterm"} labor symptoms and general obstetric precautions including but not limited to vaginal bleeding, contractions, leaking of fluid and fetal movement were reviewed in detail with the patient. Please refer to After Visit Summary for other counseling recommendations.   Return in about 2 weeks (around 12/02/2017) for Routine  Prenatal Appointment.  Thomasene Mohair, MD, [facility], [facility] 11/18/2017 9:41 AM  No complaints

## 2017-11-18 NOTE — Progress Notes (Signed)
Routine Prenatal Care Visit  Subjective  Grace Griffin is a 22 y.o. G1P0000 at [redacted]w[redacted]d being seen today for ongoing prenatal care.  She is currently monitored for the following issues for this low-risk pregnancy and has Supervision of normal first pregnancy, antepartum; BMI 34.0-34.9,adult; GBS bacteriuria; and Obesity complicating pregnancy on their problem list.  ----------------------------------------------------------------------------------- Patient reports no complaints.   Contractions: Not present. Vag. Bleeding: None.  Movement: Present. Denies leaking of fluid.  ----------------------------------------------------------------------------------- The following portions of the patient's history were reviewed and updated as appropriate: allergies, current medications, past family history, past medical history, past social history, past surgical history and problem list. Problem list updated.   Objective  Blood pressure 112/70, weight 206 lb (93.4 kg), last menstrual period 04/25/2017. Pregravid weight 180 lb (81.6 kg) Total Weight Gain 26 lb (11.8 kg) Urinalysis:      Fetal Status: Fetal Heart Rate (bpm): 140 Fundal Height: 30 cm Movement: Present     General:  Alert, oriented and cooperative. Patient is in no acute distress.  Skin: Skin is warm and dry. No rash noted.   Cardiovascular: Normal heart rate noted  Respiratory: Normal respiratory effort, no problems with respiration noted  Abdomen: Soft, gravid, appropriate for gestational age. Pain/Pressure: Absent     Pelvic:  Cervical exam deferred        Extremities: Normal range of motion.  Edema: None  Mental Status: Normal mood and affect. Normal behavior. Normal judgment and thought content.   Assessment   21 y.o. G1P0000 at [redacted]w[redacted]d by  01/30/2018, by Last Menstrual Period presenting for routine prenatal visit  Plan   FIRST Problems (from 06/07/17 to present)    Problem Noted Resolved   Obesity complicating pregnancy  11/18/2017 by Conard Novak, MD No   GBS bacteriuria 06/13/2017 by Oswaldo Conroy, CNM No   Supervision of normal first pregnancy, antepartum 06/07/2017 by Oswaldo Conroy, CNM No   Overview Addendum 10/10/2017 10:43 AM by Natale Milch, MD    [facility] Prenatal Labs  Dating  LMP = 6wk [place] Blood type: O/Positive/-- (01/14 1130)   Genetic Screen 1 Screen: Negative Antibody:Negative (01/14 1130)  Anatomic [place] complete Rubella: 5.78 (01/14 1130) Varicella: Immune  GTT Early: 63             Third trimester:  RPR: Non Reactive (01/14 1130)   Rhogam  not needed HBsAg: Negative (01/14 1130)   TDaP vaccine                        Flu Shot: HIV: Non Reactive (01/14 1130)   Baby Food   Breast                             GBS: Bacteriuria in 1st trimester  Contraception Given information Pap: Under 21  CBB  Given Information   CS/VBAC Not applicable   Support Person              [redacted] weeks gestation of pregnancy 06/13/2017 by Conard Novak, MD 07/11/2017 by Oswaldo Conroy, CNM       Preterm labor symptoms and general obstetric precautions including but not limited to vaginal bleeding, contractions, leaking of fluid and fetal movement were reviewed in detail with the patient. Please refer to After Visit Summary for other counseling recommendations.   Return in about 2 weeks (around 12/02/2017) for Routine Prenatal Appointment.  Thomasene Mohair,  MD, Merlinda FrederickFACOG [facility], [facility] 11/18/2017 9:40 AM

## 2017-11-18 NOTE — Progress Notes (Deleted)
Routine Prenatal Care Visit  Subjective  Grace Griffin is a 22 y.o. G1P0000 at [redacted]w[redacted]d being seen today for ongoing prenatal care.  She is currently monitored for the following issues for this {Blank single:19197::"high-risk","low-risk"} pregnancy and has Supervision of normal first pregnancy, antepartum; BMI 34.0-34.9,adult; GBS bacteriuria; and Obesity complicating pregnancy on their problem list.  ----------------------------------------------------------------------------------- Patient reports {sx:14538}.   Contractions: Not present. Vag. Bleeding: None.  Movement: Present. Denies leaking of fluid.  ----------------------------------------------------------------------------------- The following portions of the patient's history were reviewed and updated as appropriate: allergies, current medications, past family history, past medical history, past social history, past surgical history and problem list. Problem list updated.   Objective  Blood pressure 112/70, weight 206 lb (93.4 kg), last menstrual period 04/25/2017. Pregravid weight 180 lb (81.6 kg) Total Weight Gain 26 lb (11.8 kg) Urinalysis:      Fetal Status: Fetal Heart Rate (bpm): 140 Fundal Height: 30 cm Movement: Present     General:  Alert, oriented and cooperative. Patient is in no acute distress.  Skin: Skin is warm and dry. No rash noted.   Cardiovascular: Normal heart rate noted  Respiratory: Normal respiratory effort, no problems with respiration noted  Abdomen: Soft, gravid, appropriate for gestational age. Pain/Pressure: Absent     Pelvic:  {Blank single:19197::"Cervical exam performed","Cervical exam deferred"}        Extremities: Normal range of motion.  Edema: None  Mental Status: Normal mood and affect. Normal behavior. Normal judgment and thought content.   Assessment   21 y.o. G1P0000 at [redacted]w[redacted]d by  01/30/2018, by Last Menstrual Period presenting for {Blank single:19197::"routine","work-in"} prenatal  visit  Plan   FIRST Problems (from 06/07/17 to present)    Problem Noted Resolved   Obesity complicating pregnancy 11/18/2017 by Duell Holdren D, MD No   GBS bacteriuria 06/13/2017 by Schmid, Jacelyn Y, CNM No   Supervision of normal first pregnancy, antepartum 06/07/2017 by Schmid, Jacelyn Y, CNM No   Overview Addendum 10/10/2017 10:43 AM by Schuman, Christanna R, MD    [facility] Prenatal Labs  Dating  LMP = 6wk US Blood type: O/Positive/-- (01/14 1130)   Genetic Screen 1 Screen: Negative Antibody:Negative (01/14 1130)  Anatomic US complete Rubella: 5.78 (01/14 1130) Varicella: Immune  GTT Early: 63             Third trimester:  RPR: Non Reactive (01/14 1130)   Rhogam  not needed HBsAg: Negative (01/14 1130)   TDaP vaccine                        Flu Shot: HIV: Non Reactive (01/14 1130)   Baby Food   Breast                             GBS: Bacteriuria in 1st trimester  Contraception Given information Pap: Under 21  CBB  Given Information   CS/VBAC Not applicable   Support Person              [redacted] weeks gestation of pregnancy 06/13/2017 by Constancia Geeting D, MD 07/11/2017 by Schmid, Jacelyn Y, CNM       {Blank single:19197::"Term","Preterm"} labor symptoms and general obstetric precautions including but not limited to vaginal bleeding, contractions, leaking of fluid and fetal movement were reviewed in detail with the patient. Please refer to After Visit Summary for other counseling recommendations.   Return in about 2 weeks (around 12/02/2017) for Routine   Prenatal Appointment.  Comfort Iversen, MD, FACOG [facility] 11/18/2017 9:41 AM  No complaints 

## 2017-11-25 ENCOUNTER — Encounter: Payer: Self-pay | Admitting: Obstetrics and Gynecology

## 2017-11-26 ENCOUNTER — Emergency Department
Admission: EM | Admit: 2017-11-26 | Discharge: 2017-11-26 | Disposition: A | Discharge status: Home / Self Care | Payer: Medicaid Other | Attending: Emergency Medicine | Admitting: Emergency Medicine

## 2017-11-26 ENCOUNTER — Encounter: Payer: Self-pay | Admitting: *Deleted

## 2017-11-26 ENCOUNTER — Other Ambulatory Visit: Payer: Self-pay

## 2017-11-26 DIAGNOSIS — Z3A30 30 weeks gestation of pregnancy: Secondary | ICD-10-CM | POA: Diagnosis not present

## 2017-11-26 DIAGNOSIS — R200 Anesthesia of skin: Secondary | ICD-10-CM | POA: Diagnosis not present

## 2017-11-26 DIAGNOSIS — G5601 Carpal tunnel syndrome, right upper limb: Secondary | ICD-10-CM | POA: Insufficient documentation

## 2017-11-26 DIAGNOSIS — O26899 Other specified pregnancy related conditions, unspecified trimester: Secondary | ICD-10-CM

## 2017-11-26 DIAGNOSIS — O9989 Other specified diseases and conditions complicating pregnancy, childbirth and the puerperium: Secondary | ICD-10-CM | POA: Insufficient documentation

## 2017-11-26 DIAGNOSIS — G5600 Carpal tunnel syndrome, unspecified upper limb: Secondary | ICD-10-CM

## 2017-11-26 DIAGNOSIS — O99355 Diseases of the nervous system complicating the puerperium: Secondary | ICD-10-CM | POA: Diagnosis not present

## 2017-11-26 LAB — GLUCOSE, CAPILLARY: GLUCOSE-CAPILLARY: 134 mg/dL — AB (ref 70–99)

## 2017-11-26 MED ORDER — PREDNISONE 50 MG PO TABS: 50.0000 mg | ORAL_TABLET | Freq: Every day | ORAL | 0 refills | Status: DC

## 2017-11-26 NOTE — ED Notes (Signed)
Pt stating that she has been having numbness in her bilateral hands on and off throughout her pregnancy. Pt stating today the numbness was worse in her right hand going up her arm to her elbow. Pt stating pain is worse when she lay down.

## 2017-11-26 NOTE — ED Provider Notes (Signed)
[facility] Emergency Department Provider Note  ____________________________________________  Time seen: Approximately 3:27 PM  I have reviewed the triage vital signs and the nursing notes.   HISTORY  Chief Complaint Hand Pain and Arm Pain    HPI Grace Griffin is a 22 y.o. female who presents the emergency department complaining of bilateral hands and forearm numbness x1 week.  Patient reports that she is [redacted] weeks pregnant and over the last week she has noticed some numbness and tingling in bilateral hands.  Patient reports that she called her OB/GYN and was scheduled for an appointment in 5 days however symptoms in her right extremity are worsening.  She reports that she was told by her OB/GYN that this was likely carpal tunnel syndrome from pregnancy, however with increasing pain and numbness to the right side, patient was concerned and presents the emergency department.  Patient denies any headache, visual changes, neck pain or stiffness, chest pain, shortness of breath, abdominal pain, nausea vomiting.  Patient denies any complications with her pregnancy.  She denies any history of neck problems or history of carpal tunnel syndrome in the past.  Given patient's pregnancy status, she is only using Tylenol for symptoms.  Symptoms are worse at nighttime.    History reviewed. No pertinent past medical history.  Patient Active Problem List   Diagnosis Date Noted  . Obesity complicating pregnancy 11/18/2017  . GBS bacteriuria 06/13/2017  . Supervision of normal first pregnancy, antepartum 06/07/2017  . BMI 34.0-34.9,adult 06/07/2017    Past Surgical History:  Procedure Laterality Date  . TONSILLECTOMY     AND ADENOIDECTOMY  . WISDOM TOOTH EXTRACTION      Prior to Admission medications   Medication Sig Start Date End Date Taking? Authorizing Provider  Doxylamine-Pyridoxine (DICLEGIS) 10-10 MG TBEC Take 2 tablets by mouth at bedtime. If symptoms persist,  add one tablet in the morning and one in the afternoon Patient not taking: Reported on 10/10/2017 06/14/17   Oswaldo ConroySchmid, Jacelyn Y, CNM  predniSONE (DELTASONE) 50 MG tablet Take 1 tablet (50 mg total) by mouth daily with breakfast. 11/26/17   Cuthriell, Delorise RoyalsJonathan D, PA-C  ranitidine (ZANTAC) 150 MG tablet Take 1 tablet (150 mg total) by mouth 2 (two) times daily. Patient not taking: Reported on 11/18/2017 10/10/17   Natale MilchSchuman, Christanna R, MD    Allergies Patient has no known allergies.  Family History  Problem Relation Age of Onset  . Breast cancer Paternal Grandmother     Social History Social History   Tobacco Use  . Smoking status: Never Smoker  . Smokeless tobacco: Never Used  Substance Use Topics  . Alcohol use: No  . Drug use: No     Review of Systems  Constitutional: No fever/chills Eyes: No visual changes.  Cardiovascular: no chest pain. Respiratory: no cough. No SOB. Gastrointestinal: No abdominal pain.  No nausea, no vomiting.  No diarrhea.  No constipation. Genitourinary: Negative for dysuria. No hematuria Musculoskeletal: Positive for bilateral hand and forearm pain and numbness, worse on right than left Skin: Negative for rash, abrasions, lacerations, ecchymosis. Neurological: Negative for headaches, focal weakness or numbness. 10-point ROS otherwise negative.  ____________________________________________   PHYSICAL EXAM:  VITAL SIGNS: ED Triage Vitals  Enc Vitals Group     BP 11/26/17 1509 133/68     Pulse Rate 11/26/17 1509 (!) 106     Resp 11/26/17 1509 14     Temp 11/26/17 1509 98 F (36.7 C)     Temp Source  11/26/17 1509 Oral     SpO2 11/26/17 1509 98 %     Weight --      Height --      Head Circumference --      Peak Flow --      Pain Score 11/26/17 1517 6     Pain Loc --      Pain Edu? --      Excl. in GC? --      Constitutional: Alert and oriented. Well appearing and in no acute distress. Eyes: Conjunctivae are normal. PERRL. EOMI. Head:  Atraumatic. Neck: No stridor.  No cervical spine tenderness to palpation.  Neck is supple full range of motion.  Cardiovascular: Normal rate, regular rhythm. Normal S1 and S2.  Good peripheral circulation. Respiratory: Normal respiratory effort without tachypnea or retractions. Lungs CTAB. Good air entry to the bases with no decreased or absent breath sounds. Gastrointestinal: Gravid abdomen.  Bowel sounds 4 quadrants. Soft and nontender to palpation. No guarding or rigidity. No CVA tenderness. Musculoskeletal: Full range of motion to all extremities. No gross deformities appreciated.  Visualization of bilateral upper extremities reveals no visible abnormality.  Visualization of the right hand and wrist is unremarkable.  Full range of motion to the wrist and all digits right hand.  Examination of the right elbow and shoulder is unremarkable.  Radial pulse intact.  Sensation intact x5 digits.  Negative Finkelstein's test.  Positive Phalen's but negative Tinel's.  Examination of the left upper extremity is unremarkable. Neurologic:  Normal speech and language. No gross focal neurologic deficits are appreciated.  Skin:  Skin is warm, dry and intact. No rash noted. Psychiatric: Mood and affect are normal. Speech and behavior are normal. Patient exhibits appropriate insight and judgement.   ____________________________________________   LABS (all labs ordered are listed, but only abnormal results are displayed)  Labs Reviewed  GLUCOSE, CAPILLARY - Abnormal; Notable for the following components:      Result Value   Glucose-Capillary 134 (*)    All other components within normal limits  CBG MONITORING, ED   ____________________________________________  EKG   ____________________________________________  RADIOLOGY   No results found.  ____________________________________________    PROCEDURES  Procedure(s) performed:    .Splint Application Date/Time: 11/26/2017 3:48 PM Performed  by: Racheal Patches, PA-C Authorized by: Racheal Patches, PA-C   Consent:    Consent obtained:  Verbal   Consent given by:  Patient   Alternatives discussed:  No treatment Pre-procedure details:    Sensation:  Normal Procedure details:    Laterality:  Right   Location:  Wrist   Wrist:  R wrist   Splint type:  Wrist   Supplies:  Prefabricated splint Post-procedure details:    Pain:  Unchanged   Sensation:  Normal   Patient tolerance of procedure:  Tolerated well, no immediate complications      Medications - No data to display   ____________________________________________   INITIAL IMPRESSION / ASSESSMENT AND PLAN / ED COURSE  Pertinent labs & imaging results that were available during my care of the patient were reviewed by me and considered in my medical decision making (see chart for details).  Review of the [place] CSRS was performed in accordance of the [facility] prior to dispensing any controlled drugs.      Patient's diagnosis is consistent with carpal tunnel syndrome.  Patient presents the emergency department complaining of pain, numbness and tingling to the bilateral upper extremities, worse on the right than left.  Examination is overall reassuring.  Patient is positive on Phalen's but negative on Tinel's.  Given the patient's pregnancy status, physical exam findings, consistent with carpal tunnel syndrome.  Given patient's pregnancy status, no imaging is obtained.  Patient will be placed on a very short course of prednisone.  She is also to take Tylenol for additional pain relief.  Patient will be placed in Velcro splint for additional symptomatic control.  Patient reports that she has had elevated blood glucose readings but had a -3-hour glucose test for gestational diabetes.  I have counseled patient on likelihood that short course of prednisone would raise her blood glucose.  Patient is to keep her appointment with OB/GYN in 5 days.. Patient will be discharged  home with prescriptions for prednisone. Patient is to follow up with OBGYN/primary care as needed or otherwise directed. Patient is given ED precautions to return to the ED for any worsening or new symptoms.     ____________________________________________  FINAL CLINICAL IMPRESSION(S) / ED DIAGNOSES  Final diagnoses:  Carpal tunnel syndrome during pregnancy  [redacted] weeks gestation of pregnancy      NEW MEDICATIONS STARTED DURING THIS VISIT:  ED Discharge Orders        Ordered    predniSONE (DELTASONE) 50 MG tablet  Daily with breakfast     11/26/17 1548          This chart was dictated using voice recognition software/Dragon. Despite best efforts to proofread, errors can occur which can change the meaning. Any change was purely unintentional.    Racheal Patches, PA-C 11/26/17 1549    Jeanmarie Plant, MD 11/26/17 2150

## 2017-11-26 NOTE — ED Triage Notes (Signed)
Pt to ED reporting pain and numbness in both hands over the past week. Pt reports the numbness in the left hand improved today but the right hand numbness persists today, Numbness and pain radiate into the right forearm and per pt radiate to the elbow/ Pulse in tact and no changes in color noted.   Pt is pregnant currently and no problems with pregnancy. Fetal heart tones are normal. No vaginal discharge or bleeding.

## 2017-11-28 ENCOUNTER — Encounter: Payer: Self-pay | Admitting: Obstetrics and Gynecology

## 2017-11-29 ENCOUNTER — Encounter: Payer: Self-pay | Admitting: *Deleted

## 2017-11-29 ENCOUNTER — Observation Stay
Admission: EM | Admit: 2017-11-29 | Discharge: 2017-11-30 | Disposition: A | Discharge status: Home / Self Care | Payer: Medicaid Other | Attending: Obstetrics and Gynecology | Admitting: Obstetrics and Gynecology

## 2017-11-29 ENCOUNTER — Other Ambulatory Visit: Payer: Self-pay

## 2017-11-29 DIAGNOSIS — O471 False labor at or after 37 completed weeks of gestation: Secondary | ICD-10-CM | POA: Diagnosis not present

## 2017-11-29 DIAGNOSIS — O9989 Other specified diseases and conditions complicating pregnancy, childbirth and the puerperium: Secondary | ICD-10-CM | POA: Diagnosis present

## 2017-11-29 DIAGNOSIS — Z3A31 31 weeks gestation of pregnancy: Secondary | ICD-10-CM | POA: Insufficient documentation

## 2017-11-29 DIAGNOSIS — R109 Unspecified abdominal pain: Secondary | ICD-10-CM

## 2017-11-29 DIAGNOSIS — Z3400 Encounter for supervision of normal first pregnancy, unspecified trimester: Secondary | ICD-10-CM

## 2017-11-29 DIAGNOSIS — O26899 Other specified pregnancy related conditions, unspecified trimester: Secondary | ICD-10-CM

## 2017-11-29 HISTORY — DX: Other specified health status: Z78.9

## 2017-11-29 LAB — URINALYSIS, COMPLETE (UACMP) WITH MICROSCOPIC
Bilirubin Urine: NEGATIVE
GLUCOSE, UA: NEGATIVE mg/dL
Hgb urine dipstick: NEGATIVE
KETONES UR: NEGATIVE mg/dL
LEUKOCYTES UA: NEGATIVE
Nitrite: NEGATIVE
PROTEIN: NEGATIVE mg/dL
Specific Gravity, Urine: 1.003 — ABNORMAL LOW (ref 1.005–1.030)
pH: 7 (ref 5.0–8.0)

## 2017-11-29 NOTE — OB Triage Note (Signed)
Pt arrived with complaints in her lower abdomen and back with "pressure in her butt." States she has had no other problem, report positive fetal movement, no LOF and no vaginal bleeding.

## 2017-11-30 ENCOUNTER — Encounter: Payer: Self-pay | Admitting: Obstetrics and Gynecology

## 2017-11-30 ENCOUNTER — Telehealth: Payer: Self-pay

## 2017-11-30 DIAGNOSIS — Z3A31 31 weeks gestation of pregnancy: Secondary | ICD-10-CM | POA: Diagnosis not present

## 2017-11-30 DIAGNOSIS — O471 False labor at or after 37 completed weeks of gestation: Secondary | ICD-10-CM | POA: Diagnosis not present

## 2017-11-30 DIAGNOSIS — O9989 Other specified diseases and conditions complicating pregnancy, childbirth and the puerperium: Secondary | ICD-10-CM | POA: Diagnosis not present

## 2017-11-30 DIAGNOSIS — R109 Unspecified abdominal pain: Secondary | ICD-10-CM | POA: Diagnosis not present

## 2017-11-30 LAB — WET PREP, GENITAL
Clue Cells Wet Prep HPF POC: NONE SEEN
SPERM: NONE SEEN
Trich, Wet Prep: NONE SEEN
Yeast Wet Prep HPF POC: NONE SEEN

## 2017-11-30 LAB — CHLAMYDIA/NGC RT PCR (ARMC ONLY)
CHLAMYDIA TR: NOT DETECTED
N gonorrhoeae: NOT DETECTED

## 2017-11-30 NOTE — Final Progress Note (Signed)
Physician Final Progress Note  Patient ID: Grace Griffin MRN: [id number] DOB/AGE: 01-12-1996 22 y.o.  Admit date: 11/29/2017 Admitting provider: Conard Novak, MD Discharge date: 11/30/2017   Admission Diagnoses:  1) intrauterine pregnancy at [redacted]w[redacted]d 2) abdominal pain in pregnancy, third trimester  Discharge Diagnoses:  1) intrauterine pregnancy at [redacted]w[redacted]d 2) abdominal pain in pregnancy, third trimester, false labor  History of Present Illness: The patient is a 22 y.o. female G1P0000 at [redacted]w[redacted]d who presents for lower abdominal and pelvic floor pain that started yesterday afternoon/evening. She noted mild contraction-like activity and pelvic floor pressure. She denies urinary symptoms, vaginal discharge, and GI complaints. She notes +FM, no LOF, no vaginal bleeding.   Hospital Course: she was admitted to labor and delivery for evaluation. She had a reactive fetal tracing. Tocometry was quiet.  She had a negative UA and wet prep.  Even though low suspicion, she had testing for gonorrhea and chlamydia, which was pending at the time of discharge.  Her vitals were normal and she was not in labor. She was reassured and labor precautions were reviewed.  She was discharged in stable condition.   Past Medical History:  Diagnosis Date  . No known health problems     Past Surgical History:  Procedure Laterality Date  . TONSILLECTOMY     AND ADENOIDECTOMY  . WISDOM TOOTH EXTRACTION      No current facility-administered medications on file prior to encounter.    Current Outpatient Medications on File Prior to Encounter  Medication Sig Dispense Refill  . Prenatal Vit-Fe Fumarate-FA (PRENATAL MULTIVITAMIN) TABS tablet Take 1 tablet by mouth daily at 12 noon.    . Doxylamine-Pyridoxine (DICLEGIS) 10-10 MG TBEC Take 2 tablets by mouth at bedtime. If symptoms persist, add one tablet in the morning and one in the afternoon (Patient not taking: Reported on 10/10/2017) 100 tablet 5  . predniSONE  (DELTASONE) 50 MG tablet Take 1 tablet (50 mg total) by mouth daily with breakfast. (Patient not taking: Reported on 11/29/2017) 5 tablet 0  . ranitidine (ZANTAC) 150 MG tablet Take 1 tablet (150 mg total) by mouth 2 (two) times daily. (Patient not taking: Reported on 11/29/2017) 60 tablet 11    No Known Allergies  Social History   Socioeconomic History  . Marital status: Single    Spouse name: Not on file  . Number of children: Not on file  . Years of education: Not on file  . Highest education level: Not on file  Occupational History  . Not on file  Social Needs  . Financial resource strain: Not on file  . Food insecurity:    Worry: Not on file    Inability: Not on file  . Transportation needs:    Medical: Not on file    Non-medical: Not on file  Tobacco Use  . Smoking status: Never Smoker  . Smokeless tobacco: Never Used  Substance and Sexual Activity  . Alcohol use: No  . Drug use: No  . Sexual activity: Yes  Lifestyle  . Physical activity:    Days per week: Not on file    Minutes per session: Not on file  . Stress: Not on file  Relationships  . Social connections:    Talks on phone: Not on file    Gets together: Not on file    Attends religious service: Not on file    Active member of club or organization: Not on file    Attends meetings of clubs or organizations:  Not on file    Relationship status: Not on file  . Intimate partner violence:    Fear of current or ex partner: Not on file    Emotionally abused: Not on file    Physically abused: Not on file    Forced sexual activity: Not on file  Other Topics Concern  . Not on file  Social History Narrative  . Not on file    Physical Exam: BP 112/70 (BP Location: Left Arm)   Pulse 81   Temp 98.2 F (36.8 C) (Oral)   Resp 18   Ht 5\' 3"  (1.6 m)   Wt 206 lb (93.4 kg)   LMP 04/25/2017   BMI 36.49 kg/m   Gen: NAD CV: RRR Pulm: CTAB Pelvic: (Female chaperone present) close (per RN), no abnormal  discharge Ext: no e/c/t  Consults: None  Significant Findings/ Diagnostic Studies:  Lab Results  Component Value Date   APPEARANCEUR CLEAR (A) 11/29/2017   GLUCOSEU NEGATIVE 11/29/2017   BILIRUBINUR NEGATIVE 11/29/2017   KETONESUR NEGATIVE 11/29/2017   LABSPEC 1.003 (L) 11/29/2017   HGBUR NEGATIVE 11/29/2017   PHURINE 7.0 11/29/2017   NITRITE NEGATIVE 11/29/2017   LEUKOCYTESUR NEGATIVE 11/29/2017   RBCU 0-5 11/29/2017   WBCU 0-5 11/29/2017   BACTERIA RARE (A) 11/29/2017   EPIU 0-5 11/29/2017    Lab Results  Component Value Date   TRICHWETPREP NONE SEEN 11/30/2017   CLUECELLS NONE SEEN 11/30/2017   WBCWETPREP MANY (A) 11/30/2017   YEASTWETPREP NONE SEEN 11/30/2017    No results found for: CHLAMYDIA, NGONORRHOEAE  Procedures: NST Baseline FHR: 125 beats/min Variability: moderate Accelerations: present Decelerations: present (a single, isolated episodic variable deceleration of short depth and duration with quick return to baseline with moderate variability).  She was monitored for 2.5 hours. Tocometry: quiet  Interpretation:  INDICATIONS: rule out uterine contractions RESULTS:  A NST procedure was performed with FHR monitoring and a normal baseline established, appropriate time of 20-40 minutes of evaluation, and accels >2 seen w 15x15 characteristics.  Results show a REACTIVE NST.    Discharge Condition: stable  Disposition: Discharge disposition: 01-Home or Self Care       Diet: Regular diet  Discharge Activity: Activity as tolerated   Allergies as of 11/30/2017   No Known Allergies     Medication List    STOP taking these medications   Doxylamine-Pyridoxine 10-10 MG Tbec Commonly known as:  DICLEGIS   predniSONE 50 MG tablet Commonly known as:  DELTASONE   ranitidine 150 MG tablet Commonly known as:  ZANTAC     TAKE these medications   prenatal multivitamin Tabs tablet Take 1 tablet by mouth daily at 12 noon.      Follow-up Information     Conard NovakJackson, Soumya Colson D, MD Follow up on 12/02/2017.   Specialty:  Obstetrics and Gynecology Why:  Keep previously scheduled appointment Contact information: [address] [place] [address] [phone]           Total time spent taking care of this patient: 45 minutes  Signed: Thomasene MohairStephen Akeen Ledyard, MD  11/30/2017, 1:54 AM

## 2017-11-30 NOTE — Telephone Encounter (Signed)
Called pt to check and see how she was after receiving the [facility] report, Pt did not answer and voice mail box not set up.

## 2017-11-30 NOTE — Progress Notes (Signed)
Pt discharged home with significant other. Discharge instructions reviewed. Pt verbalized understanding.

## 2017-11-30 NOTE — Discharge Summary (Signed)
See Final Progress Note 

## 2017-12-02 ENCOUNTER — Encounter: Payer: Self-pay | Admitting: Obstetrics and Gynecology

## 2017-12-02 ENCOUNTER — Ambulatory Visit (INDEPENDENT_AMBULATORY_CARE_PROVIDER_SITE_OTHER): Payer: Medicaid Other | Admitting: Obstetrics and Gynecology

## 2017-12-02 VITALS — BP 104/68 | Wt 211.0 lb

## 2017-12-02 DIAGNOSIS — Z3A31 31 weeks gestation of pregnancy: Secondary | ICD-10-CM | POA: Diagnosis not present

## 2017-12-02 DIAGNOSIS — Z23 Encounter for immunization: Secondary | ICD-10-CM

## 2017-12-02 DIAGNOSIS — R8271 Bacteriuria: Secondary | ICD-10-CM

## 2017-12-02 DIAGNOSIS — O9989 Other specified diseases and conditions complicating pregnancy, childbirth and the puerperium: Secondary | ICD-10-CM

## 2017-12-02 DIAGNOSIS — O99213 Obesity complicating pregnancy, third trimester: Secondary | ICD-10-CM

## 2017-12-02 DIAGNOSIS — Z3400 Encounter for supervision of normal first pregnancy, unspecified trimester: Secondary | ICD-10-CM

## 2017-12-02 DIAGNOSIS — Z6834 Body mass index (BMI) 34.0-34.9, adult: Secondary | ICD-10-CM

## 2017-12-02 NOTE — Progress Notes (Signed)
Routine Prenatal Care Visit  Subjective  Grace Griffin is a 22 y.o. G1P0000 at [redacted]w[redacted]d being seen today for ongoing prenatal care.  She is currently monitored for the following issues for this low-risk pregnancy and has Supervision of normal first pregnancy, antepartum; BMI 34.0-34.9,adult; GBS bacteriuria; Obesity complicating pregnancy; and Abdominal pain affecting pregnancy on their problem list.  ----------------------------------------------------------------------------------- Patient reports no complaints.   Contractions: Not present. Vag. Bleeding: None.  Movement: Present. Denies leaking of fluid. No abdominal pain (patient on L&D early morning 7/3 for abd pain. No source found.).  ----------------------------------------------------------------------------------- The following portions of the patient's history were reviewed and updated as appropriate: allergies, current medications, past family history, past medical history, past social history, past surgical history and problem list. Problem list updated.   Objective  Blood pressure 104/68, weight 211 lb (95.7 kg), last menstrual period 04/25/2017. Pregravid weight 180 lb (81.6 kg) Total Weight Gain 31 lb (14.1 kg) Urinalysis:      Fetal Status: Fetal Heart Rate (bpm): 145 Fundal Height: 31 cm Movement: Present     General:  Alert, oriented and cooperative. Patient is in no acute distress.  Skin: Skin is warm and dry. No rash noted.   Cardiovascular: Normal heart rate noted  Respiratory: Normal respiratory effort, no problems with respiration noted  Abdomen: Soft, gravid, appropriate for gestational age. Pain/Pressure: Absent     Pelvic:  Cervical exam deferred        Extremities: Normal range of motion.  Edema: None  Mental Status: Normal mood and affect. Normal behavior. Normal judgment and thought content.   Assessment   21 y.o. G1P0000 at [redacted]w[redacted]d by  01/30/2018, by Last Menstrual Period presenting for routine prenatal  visit  Plan   FIRST Problems (from 06/07/17 to present)    Problem Noted Resolved   Obesity complicating pregnancy 11/18/2017 by Conard Novak, MD No   GBS bacteriuria 06/13/2017 by Oswaldo Conroy, CNM No   Supervision of normal first pregnancy, antepartum 06/07/2017 by Oswaldo Conroy, CNM No   Overview Addendum 10/10/2017 10:43 AM by Natale Milch, MD    Clinic [facility] Prenatal Labs  Dating  LMP = 6wk [place] Blood type: O/Positive/-- (01/14 1130)   Genetic Screen 1 Screen: Negative Antibody:Negative (01/14 1130)  Anatomic [place] complete Rubella: 5.78 (01/14 1130) Varicella: Immune  GTT Early: 63             Third trimester:  RPR: Non Reactive (01/14 1130)   Rhogam  not needed HBsAg: Negative (01/14 1130)   TDaP vaccine                        Flu Shot: HIV: Non Reactive (01/14 1130)   Baby Food   Breast                             GBS: Bacteriuria in 1st trimester  Contraception Given information Pap: Under 21  CBB  Given Information   CS/VBAC Not applicable   Support Person              [redacted] weeks gestation of pregnancy 06/13/2017 by Conard Novak, MD 07/11/2017 by Oswaldo Conroy, CNM       Preterm labor symptoms and general obstetric precautions including but not limited to vaginal bleeding, contractions, leaking of fluid and fetal movement were reviewed in detail with the patient. Please refer to After Visit Summary for  other counseling recommendations.   -TDAP today and blood transfusion consent form signed.  Return in about 2 weeks (around 12/16/2017) for Routine Prenatal Appointment.  Thomasene MohairStephen Tyrone Balash, MD, Merlinda FrederickFACOG [facility], [facility] 12/02/2017 3:25 PM

## 2017-12-02 NOTE — Addendum Note (Signed)
Addended by: Ellin GoodieLOWE, Jearld Hemp S on: 12/02/2017 03:45 PM   Modules accepted: Orders

## 2017-12-02 NOTE — Progress Notes (Incomplete)
Routine Prenatal Care Visit  Subjective  Grace Griffin is a 22 y.o. G1P0000 at 5333w4d being seen today for ongoing prenatal care.  She is currently monitored for the following issues for this {Blank single:19197::"high-risk","low-risk"} pregnancy and has Supervision of normal first pregnancy, antepartum; BMI 34.0-34.9,adult; GBS bacteriuria; Obesity complicating pregnancy; and Abdominal pain affecting pregnancy on their problem list.  ----------------------------------------------------------------------------------- Patient reports {sx:14538}.   Contractions: Not present. Vag. Bleeding: None.  Movement: Present. Denies leaking of fluid.  ----------------------------------------------------------------------------------- The following portions of the patient's history were reviewed and updated as appropriate: allergies, current medications, past family history, past medical history, past social history, past surgical history and problem list. Problem list updated.   Objective  Blood pressure 104/68, weight 211 lb (95.7 kg), last menstrual period 04/25/2017. Pregravid weight 180 lb (81.6 kg) Total Weight Gain 31 lb (14.1 kg) Urinalysis:      Fetal Status: Fetal Heart Rate (bpm): 145 Fundal Height: 31 cm Movement: Present     General:  Alert, oriented and cooperative. Patient is in no acute distress.  Skin: Skin is warm and dry. No rash noted.   Cardiovascular: Normal heart rate noted  Respiratory: Normal respiratory effort, no problems with respiration noted  Abdomen: Soft, gravid, appropriate for gestational age. Pain/Pressure: Absent     Pelvic:  {Blank single:19197::"Cervical exam performed","Cervical exam deferred"}        Extremities: Normal range of motion.  Edema: None  Mental Status: Normal mood and affect. Normal behavior. Normal judgment and thought content.   Assessment   21 y.o. G1P0000 at 2333w4d by  01/30/2018, by Last Menstrual Period presenting for {Blank  single:19197::"routine","work-in"} prenatal visit  Plan   FIRST Problems (from 06/07/17 to present)    Problem Noted Resolved   Obesity complicating pregnancy 11/18/2017 by Conard NovakJackson, Arshad Oberholzer D, MD No   GBS bacteriuria 06/13/2017 by Oswaldo ConroySchmid, Jacelyn Y, CNM No   Supervision of normal first pregnancy, antepartum 06/07/2017 by Oswaldo ConroySchmid, Jacelyn Y, CNM No   Overview Addendum 10/10/2017 10:43 AM by Natale MilchSchuman, Christanna R, MD    Clinic [facility] Prenatal Labs  Dating  LMP = 6wk US Blood type: O/Positive/-- (01/14 1130)   Genetic Screen 1 Screen: Negative Antibody:Negative (01/14 1130)  Anatomic US complete Rubella: 5.78 (01/14 1130) Varicella: Immune  GTT Early: 63             Third trimester:  RPR: Non Reactive (01/14 1130)   Rhogam  not needed HBsAg: Negative (01/14 1130)   TDaP vaccine                        Flu Shot: HIV: Non Reactive (01/14 1130)   Baby Food   Breast                             GBS: Bacteriuria in 1st trimester  Contraception Given information Pap: Under 21  CBB  Given Information   CS/VBAC Not applicable   Support Person              [redacted] weeks gestation of pregnancy 06/13/2017 by Conard NovakJackson, Reon Hunley D, MD 07/11/2017 by Oswaldo ConroySchmid, Jacelyn Y, CNM       {Blank single:19197::"Term","Preterm"} labor symptoms and general obstetric precautions including but not limited to vaginal bleeding, contractions, leaking of fluid and fetal movement were reviewed in detail with the patient. Please refer to After Visit Summary for other counseling recommendations.   Return in about 2 weeks (  around 12/16/2017) for Routine Prenatal Appointment.  Thomasene Mohair, MD, [facility], [facility] 12/02/2017 3:25 PM

## 2017-12-11 ENCOUNTER — Encounter: Payer: Self-pay | Admitting: Obstetrics and Gynecology

## 2017-12-12 ENCOUNTER — Encounter: Payer: Self-pay | Admitting: Obstetrics and Gynecology

## 2017-12-12 NOTE — Telephone Encounter (Signed)
Pt called nurse line re the same thing.  Adv pt that it does look like the mucus plug.  It's probably not all of the mucus plug.  There's nothing to do.  It doesn't mean labor will start any time soon. She may see more of it or she may not. To relax and enjoy her pregnancy.

## 2017-12-16 ENCOUNTER — Ambulatory Visit (INDEPENDENT_AMBULATORY_CARE_PROVIDER_SITE_OTHER): Payer: Medicaid Other | Admitting: Obstetrics and Gynecology

## 2017-12-16 ENCOUNTER — Encounter: Payer: Self-pay | Admitting: Obstetrics and Gynecology

## 2017-12-16 VITALS — BP 108/62 | Wt 218.0 lb

## 2017-12-16 DIAGNOSIS — Z3400 Encounter for supervision of normal first pregnancy, unspecified trimester: Secondary | ICD-10-CM

## 2017-12-16 DIAGNOSIS — O99213 Obesity complicating pregnancy, third trimester: Secondary | ICD-10-CM

## 2017-12-16 DIAGNOSIS — O9989 Other specified diseases and conditions complicating pregnancy, childbirth and the puerperium: Secondary | ICD-10-CM

## 2017-12-16 DIAGNOSIS — Z6834 Body mass index (BMI) 34.0-34.9, adult: Secondary | ICD-10-CM

## 2017-12-16 DIAGNOSIS — R8271 Bacteriuria: Secondary | ICD-10-CM

## 2017-12-16 DIAGNOSIS — Z3A33 33 weeks gestation of pregnancy: Secondary | ICD-10-CM

## 2017-12-16 NOTE — Progress Notes (Incomplete)
Routine Prenatal Care Visit  Subjective  Grace Griffin is a 22 y.o. G1P0000 at 3129w4d being seen today for ongoing prenatal care.  She is currently monitored for the following issues for this {Blank single:19197::"high-risk","low-risk"} pregnancy and has Supervision of normal first pregnancy, antepartum; BMI 34.0-34.9,adult; GBS bacteriuria; Obesity complicating pregnancy; and Abdominal pain affecting pregnancy on their problem list.  ----------------------------------------------------------------------------------- Patient reports {sx:14538}.   Contractions: Not present. Vag. Bleeding: None.  Movement: Present. Denies leaking of fluid.  ----------------------------------------------------------------------------------- The following portions of the patient's history were reviewed and updated as appropriate: allergies, current medications, past family history, past medical history, past social history, past surgical history and problem list. Problem list updated.   Objective  Blood pressure 108/62, weight 218 lb (98.9 kg), last menstrual period 04/25/2017. Pregravid weight 180 lb (81.6 kg) Total Weight Gain 38 lb (17.2 kg) Urinalysis:      Fetal Status: Fetal Heart Rate (bpm): 125 Fundal Height: 36 cm Movement: Present     General:  Alert, oriented and cooperative. Patient is in no acute distress.  Skin: Skin is warm and dry. No rash noted.   Cardiovascular: Normal heart rate noted  Respiratory: Normal respiratory effort, no problems with respiration noted  Abdomen: Soft, gravid, appropriate for gestational age. Pain/Pressure: Absent     Pelvic:  {Blank single:19197::"Cervical exam performed","Cervical exam deferred"}        Extremities: Normal range of motion.  Edema: None  Mental Status: Normal mood and affect. Normal behavior. Normal judgment and thought content.   Assessment   22 y.o. G1P0000 at 3029w4d by  01/30/2018, by Last Menstrual Period presenting for {Blank  single:19197::"routine","work-in"} prenatal visit  Plan   FIRST Problems (from 06/07/17 to present)    Problem Noted Resolved   Obesity complicating pregnancy 11/18/2017 by Conard NovakJackson, Janeth Terry D, MD No   GBS bacteriuria 06/13/2017 by Oswaldo ConroySchmid, Jacelyn Y, CNM No   Supervision of normal first pregnancy, antepartum 06/07/2017 by Oswaldo ConroySchmid, Jacelyn Y, CNM No   Overview Addendum 10/10/2017 10:43 AM by Natale MilchSchuman, Christanna R, MD    Clinic [facility] Prenatal Labs  Dating  LMP = 6wk US Blood type: O/Positive/-- (01/14 1130)   Genetic Screen 1 Screen: Negative Antibody:Negative (01/14 1130)  Anatomic US complete Rubella: 5.78 (01/14 1130) Varicella: Immune  GTT Early: 63             Third trimester:  RPR: Non Reactive (01/14 1130)   Rhogam  not needed HBsAg: Negative (01/14 1130)   TDaP vaccine                        Flu Shot: HIV: Non Reactive (01/14 1130)   Baby Food   Breast                             GBS: Bacteriuria in 1st trimester  Contraception Given information Pap: Under 21  CBB  Given Information   CS/VBAC Not applicable   Support Person              [redacted] weeks gestation of pregnancy 06/13/2017 by Conard NovakJackson, Iyesha Such D, MD 07/11/2017 by Oswaldo ConroySchmid, Jacelyn Y, CNM       {Blank single:19197::"Term","Preterm"} labor symptoms and general obstetric precautions including but not limited to vaginal bleeding, contractions, leaking of fluid and fetal movement were reviewed in detail with the patient. Please refer to After Visit Summary for other counseling recommendations.   Return in about 3 weeks (  around 01/06/2018) for schedule u/s for growth and Routine Prenatal Appointment.  Thomasene Mohair, MD, [facility], [facility] 12/16/2017 10:51 AM

## 2017-12-16 NOTE — Progress Notes (Addendum)
Routine Prenatal Care Visit  Subjective  Grace Griffin is a 22 y.o. G1P0000 at [redacted]w[redacted]d being seen today for ongoing prenatal care.  She is currently monitored for the following issues for this low-risk pregnancy and has Supervision of normal first pregnancy, antepartum; BMI 34.0-34.9,adult; GBS bacteriuria; Obesity complicating pregnancy; and Abdominal pain affecting pregnancy on their problem list.  ----------------------------------------------------------------------------------- Patient reports no complaints.   Contractions: Not present. Vag. Bleeding: None.  Movement: Present. Denies leaking of fluid.  ----------------------------------------------------------------------------------- The following portions of the patient's history were reviewed and updated as appropriate: allergies, current medications, past family history, past medical history, past social history, past surgical history and problem list. Problem list updated.   Objective  Blood pressure 108/62, weight 218 lb (98.9 kg), last menstrual period 04/25/2017. Pregravid weight 180 lb (81.6 kg) Total Weight Gain 38 lb (17.2 kg) Urinalysis:      Fetal Status: Fetal Heart Rate (bpm): 125 Fundal Height: 36 cm Movement: Present     General:  Alert, oriented and cooperative. Patient is in no acute distress.  Skin: Skin is warm and dry. No rash noted.   Cardiovascular: Normal heart rate noted  Respiratory: Normal respiratory effort, no problems with respiration noted  Abdomen: Soft, gravid, appropriate for gestational age. Pain/Pressure: Absent     Pelvic:  Cervical exam deferred        Extremities: Normal range of motion.  Edema: None  Mental Status: Normal mood and affect. Normal behavior. Normal judgment and thought content.   Assessment   22 y.o. G1P0000 at [redacted]w[redacted]d by  01/30/2018, by Last Menstrual Period presenting for routine prenatal visit  Plan   FIRST Problems (from 06/07/17 to present)    Problem Noted Resolved    Obesity complicating pregnancy 11/18/2017 by Conard Novak, MD No   GBS bacteriuria 06/13/2017 by Oswaldo Conroy, CNM No   Supervision of normal first pregnancy, antepartum 06/07/2017 by Oswaldo Conroy, CNM No   Overview Addendum 10/10/2017 10:43 AM by Natale Milch, MD    Clinic [facility] Prenatal Labs  Dating  LMP = 6wk [place] Blood type: O/Positive/-- (01/14 1130)   Genetic Screen 1 Screen: Negative Antibody:Negative (01/14 1130)  Anatomic [place] complete Rubella: 5.78 (01/14 1130) Varicella: Immune  GTT Early: 63             Third trimester:  RPR: Non Reactive (01/14 1130)   Rhogam  not needed HBsAg: Negative (01/14 1130)   TDaP vaccine                        Flu Shot: HIV: Non Reactive (01/14 1130)   Baby Food   Breast                             GBS: Bacteriuria in 1st trimester  Contraception Given information Pap: Under 21  CBB  Given Information   CS/VBAC Not applicable   Support Person              [redacted] weeks gestation of pregnancy 06/13/2017 by Conard Novak, MD 07/11/2017 by Oswaldo Conroy, CNM       Preterm labor symptoms and general obstetric precautions including but not limited to vaginal bleeding, contractions, leaking of fluid and fetal movement were reviewed in detail with the patient. Please refer to After Visit Summary for other counseling recommendations.   -[place] for size measurement greater than dates  Return in  about 3 days (around 12/19/2017) for schedule u/s for growth and Routine Prenatal Appointment.  Thomasene MohairStephen Tomer Chalmers, MD, Merlinda FrederickFACOG [facility], [facility] 12/16/2017 10:56 AM

## 2017-12-19 ENCOUNTER — Encounter: Payer: Self-pay | Admitting: Obstetrics and Gynecology

## 2017-12-19 ENCOUNTER — Ambulatory Visit (INDEPENDENT_AMBULATORY_CARE_PROVIDER_SITE_OTHER): Payer: Medicaid Other

## 2017-12-19 ENCOUNTER — Ambulatory Visit (INDEPENDENT_AMBULATORY_CARE_PROVIDER_SITE_OTHER): Payer: Medicaid Other | Admitting: Obstetrics and Gynecology

## 2017-12-19 ENCOUNTER — Other Ambulatory Visit: Payer: Self-pay | Admitting: Obstetrics and Gynecology

## 2017-12-19 VITALS — BP 122/68 | Wt 217.0 lb

## 2017-12-19 DIAGNOSIS — O9989 Other specified diseases and conditions complicating pregnancy, childbirth and the puerperium: Secondary | ICD-10-CM

## 2017-12-19 DIAGNOSIS — R8271 Bacteriuria: Secondary | ICD-10-CM

## 2017-12-19 DIAGNOSIS — Z3400 Encounter for supervision of normal first pregnancy, unspecified trimester: Secondary | ICD-10-CM

## 2017-12-19 DIAGNOSIS — Z3A35 35 weeks gestation of pregnancy: Secondary | ICD-10-CM

## 2017-12-19 DIAGNOSIS — Z3403 Encounter for supervision of normal first pregnancy, third trimester: Secondary | ICD-10-CM

## 2017-12-19 DIAGNOSIS — Z6834 Body mass index (BMI) 34.0-34.9, adult: Secondary | ICD-10-CM

## 2017-12-19 DIAGNOSIS — O99213 Obesity complicating pregnancy, third trimester: Secondary | ICD-10-CM

## 2017-12-19 DIAGNOSIS — Z3A34 34 weeks gestation of pregnancy: Secondary | ICD-10-CM

## 2017-12-19 NOTE — Progress Notes (Incomplete)
Routine Prenatal Care Visit  Subjective  Grace Griffin is a 22 y.o. G1P0000 at 4824w0d being seen today for ongoing prenatal care.  She is currently monitored for the following issues for this {Blank single:19197::"high-risk","low-risk"} pregnancy and has Supervision of normal first pregnancy, antepartum; BMI 34.0-34.9,adult; GBS bacteriuria; Obesity complicating pregnancy; and Abdominal pain affecting pregnancy on their problem list.  ----------------------------------------------------------------------------------- Patient reports {sx:14538}.   Contractions: Irritability. Vag. Bleeding: None.  Movement: Present. Denies leaking of fluid.  ----------------------------------------------------------------------------------- The following portions of the patient's history were reviewed and updated as appropriate: allergies, current medications, past family history, past medical history, past social history, past surgical history and problem list. Problem list updated.   Objective  Blood pressure 122/68, weight 217 lb (98.4 kg), last menstrual period 04/25/2017. Pregravid weight 180 lb (81.6 kg) Total Weight Gain 37 lb (16.8 kg) Urinalysis: Urine Protein: Negative Urine Glucose: Negative  Fetal Status: Fetal Heart Rate (bpm): 148 Fundal Height: 36 cm Movement: Present  Presentation: Vertex  General:  Alert, oriented and cooperative. Patient is in no acute distress.  Skin: Skin is warm and dry. No rash noted.   Cardiovascular: Normal heart rate noted  Respiratory: Normal respiratory effort, no problems with respiration noted  Abdomen: Soft, gravid, appropriate for gestational age. Pain/Pressure: Absent     Pelvic:  {Blank single:19197::"Cervical exam performed","Cervical exam deferred"}        Extremities: Normal range of motion.  Edema: None  Mental Status: Normal mood and affect. Normal behavior. Normal judgment and thought content.   Assessment   22 y.o. G1P0000 at 2524w0d by   01/30/2018, by Last Menstrual Period presenting for {Blank single:19197::"routine","work-in"} prenatal visit  Plan   FIRST Problems (from 06/07/17 to present)    Problem Noted Resolved   Obesity complicating pregnancy 11/18/2017 by Conard NovakJackson, Eusevio Schriver D, MD No   GBS bacteriuria 06/13/2017 by Oswaldo ConroySchmid, Jacelyn Y, CNM No   Supervision of normal first pregnancy, antepartum 06/07/2017 by Oswaldo ConroySchmid, Jacelyn Y, CNM No   Overview Addendum 10/10/2017 10:43 AM by Natale MilchSchuman, Christanna R, MD    Clinic [facility] Prenatal Labs  Dating  LMP = 6wk US Blood type: O/Positive/-- (01/14 1130)   Genetic Screen 1 Screen: Negative Antibody:Negative (01/14 1130)  Anatomic US complete Rubella: 5.78 (01/14 1130) Varicella: Immune  GTT Early: 63             Third trimester:  RPR: Non Reactive (01/14 1130)   Rhogam  not needed HBsAg: Negative (01/14 1130)   TDaP vaccine                        Flu Shot: HIV: Non Reactive (01/14 1130)   Baby Food   Breast                             GBS: Bacteriuria in 1st trimester  Contraception Given information Pap: Under 21  CBB  Given Information   CS/VBAC Not applicable   Support Person              [redacted] weeks gestation of pregnancy 06/13/2017 by Conard NovakJackson, Rashawn Rolon D, MD 07/11/2017 by Oswaldo ConroySchmid, Jacelyn Y, CNM       {Blank single:19197::"Term","Preterm"} labor symptoms and general obstetric precautions including but not limited to vaginal bleeding, contractions, leaking of fluid and fetal movement were reviewed in detail with the patient. Please refer to After Visit Summary for other counseling recommendations.   Return in about 2  weeks (around 01/02/2018) for Routine Prenatal Appointment.  Thomasene Mohair, MD, [facility], [facility] 12/19/2017 3:18 PM

## 2017-12-19 NOTE — Progress Notes (Signed)
Routine Prenatal Care Visit  Subjective  Grace Griffin is a 22 y.o. G1P0000 at [redacted]w[redacted]d being seen today for ongoing prenatal care.  She is currently monitored for the following issues for this high-risk pregnancy and has Supervision of normal first pregnancy, antepartum; BMI 34.0-34.9,adult; GBS bacteriuria; Obesity complicating pregnancy; and Abdominal pain affecting pregnancy on their problem list.  ----------------------------------------------------------------------------------- Patient reports no complaints.   Contractions: Irritability. Vag. Bleeding: None.  Movement: Present. Denies leaking of fluid.  ----------------------------------------------------------------------------------- The following portions of the patient's history were reviewed and updated as appropriate: allergies, current medications, past family history, past medical history, past social history, past surgical history and problem list. Problem list updated.   Objective  Blood pressure 122/68, weight 217 lb (98.4 kg), last menstrual period 04/25/2017. Pregravid weight 180 lb (81.6 kg) Total Weight Gain 37 lb (16.8 kg) Urinalysis: Urine Protein: Negative Urine Glucose: Negative  Fetal Status: Fetal Heart Rate (bpm): 148 Fundal Height: 36 cm Movement: Present  Presentation: Vertex  General:  Alert, oriented and cooperative. Patient is in no acute distress.  Skin: Skin is warm and dry. No rash noted.   Cardiovascular: Normal heart rate noted  Respiratory: Normal respiratory effort, no problems with respiration noted  Abdomen: Soft, gravid, appropriate for gestational age. Pain/Pressure: Absent     Pelvic:  Cervical exam deferred        Extremities: Normal range of motion.  Edema: None  Mental Status: Normal mood and affect. Normal behavior. Normal judgment and thought content.   US Ob Follow Up  Result Date: 12/19/2017 ULTRASOUND REPORT Location: [facility] Date of Service: 12/19/2017 Patient Name: Grace Griffin DOB: June 12, 1995 MRN: [id number] Indications:Growth Findings: Mason Jim intrauterine pregnancy is visualized with FHR at 157 BPM. Biometrics give an (U/S) Gestational age of [redacted]w[redacted]d and an (U/S) EDD of 01/17/2018; this does correlate with the clinically established Estimated Date of Delivery: 01/30/18. Fetal presentation is Cephalic. Placenta: Fundal, Grade 1. AFI: Subjectively normal. Growth percentile is 71.9%ile. EFW: 2,728g; 6lbs 0oz. Impression: 1. [redacted]w[redacted]d Viable Singleton Intrauterine pregnancy; AUA does correlate with previously established criteria. 2. Growth is 71.9%ile.  AFI is subjectively normal. Recommendations: 1.Clinical correlation with the patient's History and Physical Exam. Awanda Mink RDMS, RVT The ultrasound images and findings were reviewed by me and I agree with the above report. Thomasene Mohair, MD, [facility], [facility] 12/19/2017 2:44 PM    Assessment   22 y.o. G1P0000 at [redacted]w[redacted]d by  01/30/2018, by Last Menstrual Period presenting for routine prenatal visit  Plan   FIRST Problems (from 06/07/17 to present)    Problem Noted Resolved   Obesity complicating pregnancy 11/18/2017 by Conard Novak, MD No   GBS bacteriuria 06/13/2017 by Oswaldo Conroy, CNM No   Supervision of normal first pregnancy, antepartum 06/07/2017 by Oswaldo Conroy, CNM No   Overview Addendum 10/10/2017 10:43 AM by Natale Milch, MD    Clinic [facility] Prenatal Labs  Dating  LMP = 6wk [place] Blood type: O/Positive/-- (01/14 1130)   Genetic Screen 1 Screen: Negative Antibody:Negative (01/14 1130)  Anatomic [place] complete Rubella: 5.78 (01/14 1130) Varicella: Immune  GTT Early: 63             Third trimester:  RPR: Non Reactive (01/14 1130)   Rhogam  not needed HBsAg: Negative (01/14 1130)   TDaP vaccine                        Flu  Shot: HIV: Non Reactive (01/14 1130)   Baby Food   Breast                             GBS: Bacteriuria in 1st trimester  Contraception Given  information Pap: Under 21  CBB  Given Information   CS/VBAC Not applicable   Support Person              [redacted] weeks gestation of pregnancy 06/13/2017 by Conard NovakJackson, Stephen D, MD 07/11/2017 by Oswaldo ConroySchmid, Jacelyn Y, CNM      Preterm labor symptoms and general obstetric precautions including but not limited to vaginal bleeding, contractions, leaking of fluid and fetal movement were reviewed in detail with the patient. Please refer to After Visit Summary for other counseling recommendations.   Return in about 2 weeks (around 01/02/2018) for Routine Prenatal Appointment.  Thomasene MohairStephen Jackson, MD, Merlinda FrederickFACOG [facility], [facility] 12/19/2017 3:17 PM

## 2018-01-01 ENCOUNTER — Other Ambulatory Visit: Payer: Self-pay

## 2018-01-01 ENCOUNTER — Encounter: Payer: Self-pay | Admitting: *Deleted

## 2018-01-01 ENCOUNTER — Observation Stay
Admission: EM | Admit: 2018-01-01 | Discharge: 2018-01-01 | Disposition: A | Discharge status: Home / Self Care | Payer: Medicaid Other | Attending: Obstetrics and Gynecology | Admitting: Obstetrics and Gynecology

## 2018-01-01 DIAGNOSIS — R8271 Bacteriuria: Secondary | ICD-10-CM

## 2018-01-01 DIAGNOSIS — Z3A35 35 weeks gestation of pregnancy: Secondary | ICD-10-CM | POA: Insufficient documentation

## 2018-01-01 DIAGNOSIS — O4703 False labor before 37 completed weeks of gestation, third trimester: Principal | ICD-10-CM | POA: Insufficient documentation

## 2018-01-01 DIAGNOSIS — Z3400 Encounter for supervision of normal first pregnancy, unspecified trimester: Secondary | ICD-10-CM

## 2018-01-01 DIAGNOSIS — O99213 Obesity complicating pregnancy, third trimester: Secondary | ICD-10-CM

## 2018-01-01 LAB — URINALYSIS, COMPLETE (UACMP) WITH MICROSCOPIC
BILIRUBIN URINE: NEGATIVE
Bacteria, UA: NONE SEEN
GLUCOSE, UA: NEGATIVE mg/dL
Hgb urine dipstick: NEGATIVE
KETONES UR: NEGATIVE mg/dL
LEUKOCYTES UA: NEGATIVE
Nitrite: NEGATIVE
PH: 6 (ref 5.0–8.0)
Protein, ur: NEGATIVE mg/dL
Specific Gravity, Urine: 1.008 (ref 1.005–1.030)

## 2018-01-01 MED ORDER — ACETAMINOPHEN 325 MG PO TABS
650.0000 mg | ORAL_TABLET | ORAL | Status: DC | PRN
  Administered 2018-01-01: 650 mg via ORAL
  Filled 2018-01-01: qty 2

## 2018-01-01 NOTE — Discharge Instructions (Signed)
Please keep your next scheduled appointment.  If you have any questions or concerns please call your on call provider.  You  May also call the Birthplace nurse's desk at [phone] for questions.  If you have urgent concerns please go to the nearest Emergency Department for evaluation.

## 2018-01-01 NOTE — OB Triage Note (Signed)
Patient to [facility] with complaint of contractions since 9pm last night. She feels that they are more intense today.  Her rates her pain 6/10 between contractions 8/10 during contractions. She denies any LOF or bleeding. Warm shower and walking around do not help with the contractions.  Last intercourse was Saturday around 2 pm. Patient denies urinary pain or frequency.

## 2018-01-01 NOTE — Final Progress Note (Signed)
Physician Final Progress Note  Patient ID: Grace Griffin MRN: [id number] DOB/AGE: [birth year] 22 y.o.  Admit date: 01/01/2018 Admitting provider: Vena Austria, MD Discharge date: 01/01/2018   Admission Diagnoses: Contractions  Discharge Diagnoses:  Active Problems:   Labor and delivery indication for care or intervention  22 y.o. G1P0000 at [redacted]w[redacted]d presenting with irregular contractions.  +FM, no LOF, no VB.  Cervix unchanged on recheck at [facility], negative UA.  Consults: None  Significant Findings/ Diagnostic Studies:  Results for orders placed or performed during the hospital encounter of 01/01/18 (from the past 24 hour(s))  Urinalysis, Complete w Microscopic     Status: Abnormal   Collection Time: 01/01/18  4:12 PM  Result Value Ref Range   Color, Urine YELLOW (A) YELLOW   APPearance CLEAR (A) CLEAR   Specific Gravity, Urine 1.008 1.005 - 1.030   pH 6.0 5.0 - 8.0   Glucose, UA NEGATIVE NEGATIVE mg/dL   Hgb urine dipstick NEGATIVE NEGATIVE   Bilirubin Urine NEGATIVE NEGATIVE   Ketones, ur NEGATIVE NEGATIVE mg/dL   Protein, ur NEGATIVE NEGATIVE mg/dL   Nitrite NEGATIVE NEGATIVE   Leukocytes, UA NEGATIVE NEGATIVE   RBC / HPF 0-5 0 - 5 RBC/hpf   WBC, UA 0-5 0 - 5 WBC/hpf   Bacteria, UA NONE SEEN NONE SEEN   Squamous Epithelial / LPF 0-5 0 - 5   Mucus PRESENT      Procedures:  Baseline: 145 Variability: moderate Accelerations: present Decelerations: absent Tocometry: irritability The patient was monitored for 30 minutes, fetal heart rate tracing was deemed reactive, category I tracing,  CPT [id number]   Discharge Condition: good  Disposition: Discharge disposition: 01-Home or Self Care       Diet: Regular diet  Discharge Activity: Activity as tolerated  Discharge Instructions    Discharge activity:  No Restrictions   Complete by:  As directed    Discharge diet:  No restrictions   Complete by:  As directed    Fetal Kick Count:  Lie on our left side for one  hour after a meal, and count the number of times your baby kicks.  If it is less than 5 times, get up, move around and drink some juice.  Repeat the test 30 minutes later.  If it is still less than 5 kicks in an hour, notify your doctor.   Complete by:  As directed    LABOR:  When conractions begin, you should start to time them from the beginning of one contraction to the beginning  of the next.  When contractions are 5 - 10 minutes apart or less and have been regular for at least an hour, you should call your health care provider.   Complete by:  As directed    No sexual activity restrictions   Complete by:  As directed    Notify physician for bleeding from the vagina   Complete by:  As directed    Notify physician for blurring of vision or spots before the eyes   Complete by:  As directed    Notify physician for chills or fever   Complete by:  As directed    Notify physician for fainting spells, "black outs" or loss of consciousness   Complete by:  As directed    Notify physician for increase in vaginal discharge   Complete by:  As directed    Notify physician for leaking of fluid   Complete by:  As directed    Notify physician for pain  or burning when urinating   Complete by:  As directed    Notify physician for pelvic pressure (sudden increase)   Complete by:  As directed    Notify physician for severe or continued nausea or vomiting   Complete by:  As directed    Notify physician for sudden gushing of fluid from the vagina (with or without continued leaking)   Complete by:  As directed    Notify physician for sudden, constant, or occasional abdominal pain   Complete by:  As directed    Notify physician if baby moving less than usual   Complete by:  As directed      Allergies as of 01/01/2018   No Known Allergies     Medication List    TAKE these medications   prenatal multivitamin Tabs tablet Take 1 tablet by mouth daily at 12 noon.        Total time spent taking  care of this patient: 30 minutes  Signed: Vena Austriandreas Demeco Ducksworth 01/01/2018, 5:26 PM

## 2018-01-01 NOTE — Discharge Summary (Signed)
See final progress note. 

## 2018-01-02 ENCOUNTER — Encounter: Payer: Self-pay | Admitting: Obstetrics and Gynecology

## 2018-01-02 ENCOUNTER — Ambulatory Visit (INDEPENDENT_AMBULATORY_CARE_PROVIDER_SITE_OTHER): Payer: Medicaid Other | Admitting: Obstetrics and Gynecology

## 2018-01-02 VITALS — BP 124/82 | Wt 220.0 lb

## 2018-01-02 DIAGNOSIS — Z3A36 36 weeks gestation of pregnancy: Secondary | ICD-10-CM

## 2018-01-02 DIAGNOSIS — Z113 Encounter for screening for infections with a predominantly sexual mode of transmission: Secondary | ICD-10-CM

## 2018-01-02 DIAGNOSIS — R8271 Bacteriuria: Secondary | ICD-10-CM

## 2018-01-02 DIAGNOSIS — Z6834 Body mass index (BMI) 34.0-34.9, adult: Secondary | ICD-10-CM

## 2018-01-02 DIAGNOSIS — O99213 Obesity complicating pregnancy, third trimester: Secondary | ICD-10-CM

## 2018-01-02 DIAGNOSIS — O9989 Other specified diseases and conditions complicating pregnancy, childbirth and the puerperium: Secondary | ICD-10-CM

## 2018-01-02 DIAGNOSIS — Z3400 Encounter for supervision of normal first pregnancy, unspecified trimester: Secondary | ICD-10-CM

## 2018-01-02 NOTE — Progress Notes (Incomplete)
Routine Prenatal Care Visit  Subjective  Grace Griffin is a 22 y.o. G1P0000 at 1342w0d being seen today for ongoing prenatal care.  She is currently monitored for the following issues for this {Blank single:19197::"high-risk","low-risk"} pregnancy and has Supervision of normal first pregnancy, antepartum; BMI 34.0-34.9,adult; GBS bacteriuria; Obesity complicating pregnancy; Abdominal pain affecting pregnancy; and Labor and delivery indication for care or intervention on their problem list.  ----------------------------------------------------------------------------------- Patient reports {sx:14538}.   Contractions: Irritability. Vag. Bleeding: None.  Movement: Present. Denies leaking of fluid.  ----------------------------------------------------------------------------------- The following portions of the patient's history were reviewed and updated as appropriate: allergies, current medications, past family history, past medical history, past social history, past surgical history and problem list. Problem list updated.   Objective  Blood pressure 124/82, weight 220 lb (99.8 kg), last menstrual period 04/25/2017. Pregravid weight 180 lb (81.6 kg) Total Weight Gain 40 lb (18.1 kg) Urinalysis: Urine Protein: Negative Urine Glucose: Negative  Fetal Status: Fetal Heart Rate (bpm): 145 Fundal Height: 36 cm Movement: Present  Presentation: Vertex  General:  Alert, oriented and cooperative. Patient is in no acute distress.  Skin: Skin is warm and dry. No rash noted.   Cardiovascular: Normal heart rate noted  Respiratory: Normal respiratory effort, no problems with respiration noted  Abdomen: Soft, gravid, appropriate for gestational age. Pain/Pressure: Absent     Pelvic:  {Blank single:19197::"Cervical exam performed","Cervical exam deferred"} Dilation: 1 Effacement (%): 40 Station: -3  Extremities: Normal range of motion.  Edema: None  Mental Status: Normal mood and affect. Normal behavior.  Normal judgment and thought content.   Assessment   22 y.o. G1P0000 at 1342w0d by  01/30/2018, by Last Menstrual Period presenting for {Blank single:19197::"routine","work-in"} prenatal visit  Plan   FIRST Problems (from 06/07/17 to present)    Problem Noted Resolved   Obesity complicating pregnancy 11/18/2017 by Conard NovakJackson, Kendra Woolford D, MD No   GBS bacteriuria 06/13/2017 by Oswaldo ConroySchmid, Jacelyn Y, CNM No   Supervision of normal first pregnancy, antepartum 06/07/2017 by Oswaldo ConroySchmid, Jacelyn Y, CNM No   Overview Addendum 10/10/2017 10:43 AM by Natale MilchSchuman, Christanna R, MD    Clinic [facility] Prenatal Labs  Dating  LMP = 6wk US Blood type: O/Positive/-- (01/14 1130)   Genetic Screen 1 Screen: Negative Antibody:Negative (01/14 1130)  Anatomic US complete Rubella: 5.78 (01/14 1130) Varicella: Immune  GTT Early: 63             Third trimester:  RPR: Non Reactive (01/14 1130)   Rhogam  not needed HBsAg: Negative (01/14 1130)   TDaP vaccine                        Flu Shot: HIV: Non Reactive (01/14 1130)   Baby Food   Breast                             GBS: Bacteriuria in 1st trimester  Contraception Given information Pap: Under 21  CBB  Given Information   CS/VBAC Not applicable   Support Person              [redacted] weeks gestation of pregnancy 06/13/2017 by Conard NovakJackson, Elise Knobloch D, MD 07/11/2017 by Oswaldo ConroySchmid, Jacelyn Y, CNM       {Blank single:19197::"Term","Preterm"} labor symptoms and general obstetric precautions including but not limited to vaginal bleeding, contractions, leaking of fluid and fetal movement were reviewed in detail with the patient. Please refer to After Visit Summary for  other counseling recommendations.   Return in about 1 week (around 01/09/2018) for Routine Prenatal Appointment.  Thomasene Mohair, MD, [facility], [facility] 01/02/2018 9:40 AM

## 2018-01-02 NOTE — Progress Notes (Signed)
Routine Prenatal Care Visit  Subjective  Grace Griffin is a 22 y.o. G1P0000 at 10820w0d being seen today for ongoing prenatal care.  She is currently monitored for the following issues for this low-risk pregnancy and has Supervision of normal first pregnancy, antepartum; BMI 34.0-34.9,adult; GBS bacteriuria; Obesity complicating pregnancy; Abdominal pain affecting pregnancy; and Labor and delivery indication for care or intervention on their problem list.  ----------------------------------------------------------------------------------- Patient reports no complaints.   Contractions: Irritability. Vag. Bleeding: None.  Movement: Present. Denies leaking of fluid.  ----------------------------------------------------------------------------------- The following portions of the patient's history were reviewed and updated as appropriate: allergies, current medications, past family history, past medical history, past social history, past surgical history and problem list. Problem list updated.   Objective  Blood pressure 124/82, weight 220 lb (99.8 kg), last menstrual period 04/25/2017. Pregravid weight 180 lb (81.6 kg) Total Weight Gain 40 lb (18.1 kg) Urinalysis: Urine Protein: Negative Urine Glucose: Negative  Fetal Status: Fetal Heart Rate (bpm): 145 Fundal Height: 36 cm Movement: Present  Presentation: Vertex  General:  Alert, oriented and cooperative. Patient is in no acute distress.  Skin: Skin is warm and dry. No rash noted.   Cardiovascular: Normal heart rate noted  Respiratory: Normal respiratory effort, no problems with respiration noted  Abdomen: Soft, gravid, appropriate for gestational age. Pain/Pressure: Absent     Pelvic:  Cervical exam performed Dilation: 1 Effacement (%): 40 Station: -3  Extremities: Normal range of motion.  Edema: None  Mental Status: Normal mood and affect. Normal behavior. Normal judgment and thought content.   Assessment   22 y.o. G1P0000 at 2320w0d  by  01/30/2018, by Last Menstrual Period presenting for routine prenatal visit  Plan   FIRST Problems (from 06/07/17 to present)    Problem Noted Resolved   Obesity complicating pregnancy 11/18/2017 by Conard NovakJackson, Nzinga Ferran D, MD No   GBS bacteriuria 06/13/2017 by Oswaldo ConroySchmid, Jacelyn Y, CNM No   Supervision of normal first pregnancy, antepartum 06/07/2017 by Oswaldo ConroySchmid, Jacelyn Y, CNM No   Overview Addendum 10/10/2017 10:43 AM by Natale MilchSchuman, Christanna R, MD    [facility] Prenatal Labs  Dating  LMP = 6wk US Blood type: O/Positive/-- (01/14 1130)   Genetic Screen 1 Screen: Negative Antibody:Negative (01/14 1130)  Anatomic US complete Rubella: 5.78 (01/14 1130) Varicella: Immune  GTT Early: 63             Third trimester:  RPR: Non Reactive (01/14 1130)   Rhogam  not needed HBsAg: Negative (01/14 1130)   TDaP vaccine                        Flu Shot: HIV: Non Reactive (01/14 1130)   Baby Food   Breast                             GBS: Bacteriuria in 1st trimester  Contraception Given information Pap: Under 21  CBB  Given Information   CS/VBAC Not applicable   Support Person              [redacted] weeks gestation of pregnancy 06/13/2017 by Conard NovakJackson, Danniella Robben D, MD 07/11/2017 by Oswaldo ConroySchmid, Jacelyn Y, CNM       Preterm labor symptoms and general obstetric precautions including but not limited to vaginal bleeding, contractions, leaking of fluid and fetal movement were reviewed in detail with the patient. Please refer to After Visit Summary for other counseling recommendations.   -  GBS, Gonorrhea/chlamydia today.  - Discussed PEP/PUPPS and home treatment.   - labor precautions reviewed.   Return in about 1 week (around 01/09/2018) for Routine Prenatal Appointment.  Thomasene Mohair, MD, [facility], [facility] 01/02/2018 9:40 AM

## 2018-01-04 ENCOUNTER — Encounter: Payer: Self-pay | Admitting: Obstetrics and Gynecology

## 2018-01-04 LAB — STREP GP B NAA: Strep Gp B NAA: POSITIVE — AB

## 2018-01-05 LAB — GC/CHLAMYDIA PROBE AMP
CHLAMYDIA, DNA PROBE: NEGATIVE
NEISSERIA GONORRHOEAE BY PCR: NEGATIVE

## 2018-01-10 ENCOUNTER — Ambulatory Visit (INDEPENDENT_AMBULATORY_CARE_PROVIDER_SITE_OTHER): Payer: Medicaid Other | Admitting: Obstetrics and Gynecology

## 2018-01-10 ENCOUNTER — Encounter: Payer: Self-pay | Admitting: Obstetrics and Gynecology

## 2018-01-10 VITALS — BP 122/78 | Wt 218.5 lb

## 2018-01-10 DIAGNOSIS — R8271 Bacteriuria: Secondary | ICD-10-CM

## 2018-01-10 DIAGNOSIS — O99213 Obesity complicating pregnancy, third trimester: Secondary | ICD-10-CM

## 2018-01-10 DIAGNOSIS — Z3A37 37 weeks gestation of pregnancy: Secondary | ICD-10-CM

## 2018-01-10 DIAGNOSIS — Z6834 Body mass index (BMI) 34.0-34.9, adult: Secondary | ICD-10-CM

## 2018-01-10 DIAGNOSIS — Z3400 Encounter for supervision of normal first pregnancy, unspecified trimester: Secondary | ICD-10-CM

## 2018-01-10 LAB — POCT URINALYSIS DIPSTICK OB
GLUCOSE, UA: NEGATIVE — AB
POC,PROTEIN,UA: NEGATIVE

## 2018-01-10 NOTE — Patient Instructions (Signed)

## 2018-01-10 NOTE — Progress Notes (Signed)
Routine Prenatal Care Visit  Subjective  Grace Griffin is a 22 y.o. G1P0000 at 8938w1d being seen today for ongoing prenatal care.  She is currently monitored for the following issues for this low-risk pregnancy and has Supervision of normal first pregnancy, antepartum; BMI 34.0-34.9,adult; GBS bacteriuria; Obesity complicating pregnancy; Abdominal pain affecting pregnancy; and Labor and delivery indication for care or intervention on their problem list.  ----------------------------------------------------------------------------------- Patient reports recent cold-like sx. Tested at urgent care with negative strep test.   Contractions: Irritability. Vag. Bleeding: None.  Movement: Present. Denies leaking of fluid.  ----------------------------------------------------------------------------------- The following portions of the patient's history were reviewed and updated as appropriate: allergies, current medications, past family history, past medical history, past social history, past surgical history and problem list. Problem list updated.   Objective  Blood pressure 122/78, weight 218 lb 8 oz (99.1 kg), last menstrual period 04/25/2017. Pregravid weight 180 lb (81.6 kg) Total Weight Gain 38 lb 8 oz (17.5 kg) Urinalysis:      Fetal Status: Fetal Heart Rate (bpm): 150 Fundal Height: 37 cm Movement: Present  Presentation: Vertex  General:  Alert, oriented and cooperative. Patient is in no acute distress.  Skin: Skin is warm and dry. No rash noted.   Cardiovascular: Normal heart rate noted  Respiratory: Normal respiratory effort, no problems with respiration noted  Abdomen: Soft, gravid, appropriate for gestational age. Pain/Pressure: Absent     Pelvic:  Cervical exam performed Dilation: 1.5 Effacement (%): 50 Station: -3  Extremities: Normal range of motion.  Edema: None  Mental Status: Normal mood and affect. Normal behavior. Normal judgment and thought content.   Assessment   22  y.o. G1P0000 at 1338w1d by  01/30/2018, by Last Menstrual Period presenting for routine prenatal visit  Plan   FIRST Problems (from 06/07/17 to present)    Problem Noted Resolved   Obesity complicating pregnancy 11/18/2017 by Conard NovakJackson, Kadedra Vanaken D, MD No   GBS bacteriuria 06/13/2017 by Oswaldo ConroySchmid, Jacelyn Y, CNM No   Supervision of normal first pregnancy, antepartum 06/07/2017 by Oswaldo ConroySchmid, Jacelyn Y, CNM No   Overview Addendum 10/10/2017 10:43 AM by Natale MilchSchuman, Christanna R, MD    [facility]inic [facility] Prenatal Labs  Dating  LMP = 6wk US Blood type: O/Positive/-- (01/14 1130)   Genetic Screen 1 Screen: Negative Antibody:Negative (01/14 1130)  Anatomic US complete Rubella: 5.78 (01/14 1130) Varicella: Immune  GTT Early: 63             Third trimester:  RPR: Non Reactive (01/14 1130)   Rhogam  not needed HBsAg: Negative (01/14 1130)   TDaP vaccine                        Flu Shot: HIV: Non Reactive (01/14 1130)   Baby Food   Breast                             GBS: Bacteriuria in 1st trimester  Contraception Given information Pap: Under 21  CBB  Given Information   CS/VBAC Not applicable   Support Person              [redacted] weeks gestation of pregnancy 06/13/2017 by Conard NovakJackson, Cortlyn Cannell D, MD 07/11/2017 by Oswaldo ConroySchmid, Jacelyn Y, CNM       Term labor symptoms and general obstetric precautions including but not limited to vaginal bleeding, contractions, leaking of fluid and fetal movement were reviewed in detail with the patient. Please  refer to After Visit Summary for other counseling recommendations.   Return in about 1 week (around 01/17/2018) for Routine Prenatal Appointment.  Thomasene MohairStephen Berklie Dethlefs, MD, Merlinda FrederickFACOG [facility], [facility] 01/10/2018 3:32 PM

## 2018-01-16 ENCOUNTER — Ambulatory Visit (INDEPENDENT_AMBULATORY_CARE_PROVIDER_SITE_OTHER): Payer: Medicaid Other | Admitting: Obstetrics and Gynecology

## 2018-01-16 VITALS — BP 120/70 | Wt 220.0 lb

## 2018-01-16 DIAGNOSIS — O9989 Other specified diseases and conditions complicating pregnancy, childbirth and the puerperium: Secondary | ICD-10-CM | POA: Diagnosis not present

## 2018-01-16 DIAGNOSIS — O99213 Obesity complicating pregnancy, third trimester: Secondary | ICD-10-CM

## 2018-01-16 DIAGNOSIS — Z3A38 38 weeks gestation of pregnancy: Secondary | ICD-10-CM | POA: Diagnosis not present

## 2018-01-16 DIAGNOSIS — Z3400 Encounter for supervision of normal first pregnancy, unspecified trimester: Secondary | ICD-10-CM

## 2018-01-16 DIAGNOSIS — R8271 Bacteriuria: Secondary | ICD-10-CM | POA: Diagnosis not present

## 2018-01-16 LAB — POCT URINALYSIS DIPSTICK OB
GLUCOSE, UA: NEGATIVE — AB
PROTEIN: NEGATIVE

## 2018-01-16 MED ORDER — AZITHROMYCIN 250 MG PO TABS: ORAL_TABLET | ORAL | 1 refills | Status: DC

## 2018-01-16 NOTE — Progress Notes (Signed)
ROB Head cold/ cough

## 2018-01-16 NOTE — Progress Notes (Signed)
Routine Prenatal Care Visit  Subjective  Grace Griffin is a 22 y.o. G1P0000 at 6869w0d being seen today for ongoing prenatal care.  She is currently monitored for the following issues for this low-risk pregnancy and has Supervision of normal first pregnancy, antepartum; BMI 34.0-34.9,adult; GBS bacteriuria; Obesity complicating pregnancy; and Abdominal pain affecting pregnancy on their problem list.  ----------------------------------------------------------------------------------- Patient reports cough for past week, no fevers, no chills.  Still having irritation vaginally after OTC yeast treatement 7 days.   Contractions: Irregular. Vag. Bleeding: None.  Movement: Present. Denies leaking of fluid.  ----------------------------------------------------------------------------------- The following portions of the patient's history were reviewed and updated as appropriate: allergies, current medications, past family history, past medical history, past social history, past surgical history and problem list. Problem list updated.   Objective  Blood pressure 120/70, weight 220 lb (99.8 kg), last menstrual period 04/25/2017. Pregravid weight 180 lb (81.6 kg) Total Weight Gain 40 lb (18.1 kg) Urinalysis:      Fetal Status: Fetal Heart Rate (bpm): 140 Fundal Height: 38 cm Movement: Present  Presentation: Vertex  General:  Alert, oriented and cooperative. Patient is in no acute distress.  Skin: Skin is warm and dry. No rash noted.   Cardiovascular: Normal heart rate noted  Respiratory: Normal respiratory effort, no problems with respiration noted  Abdomen: Soft, gravid, appropriate for gestational age. Pain/Pressure: Absent     Pelvic:  Cervical exam performed Dilation: 2.5 Effacement (%): 50 Station: -2  Extremities: Normal range of motion.  Edema: None  ental Status: Normal mood and affect. Normal behavior. Normal judgment and thought content.   Wet Prep:  Clue Cells: Negative Fungal  elements: Negative Trichomonas: Negative   Assessment   22 y.o. G1P0000 at 7369w0d by  01/30/2018, by Last Menstrual Period presenting for routine prenatal visit  Plan   FIRST Problems (from 06/07/17 to present)    Problem Noted Resolved   Obesity complicating pregnancy 11/18/2017 by Conard NovakJackson, Stephen D, MD No   GBS bacteriuria 06/13/2017 by Oswaldo ConroySchmid, Jacelyn Y, CNM No   Supervision of normal first pregnancy, antepartum 06/07/2017 by Oswaldo ConroySchmid, Jacelyn Y, CNM No   Overview Addendum 01/16/2018  2:38 PM by Vena AustriaStaebler, Sophea Rackham, MD    [facility] Prenatal Labs  Dating  LMP = 6wk US Blood type: O/Positive/-- (01/14 1130)   Genetic Screen 1 Screen: Negative Antibody:Negative (01/14 1130)  Anatomic US complete Rubella: 5.78 (01/14 1130) Varicella: Immune  GTT Early: 63 Third trimester: 160 3-hr: 91, 105, 71, 72 RPR: Non Reactive (01/14 1130)   Rhogam  not needed HBsAg: Negative (01/14 1130)   TDaP vaccine 12/02/17 Flu Shot: HIV: Non Reactive (01/14 1130)   Baby Food   Breast                             GBS: Bacteriuria in 1st trimester  Contraception Given information Pap: Under 21  CBB  Given Information   CS/VBAC Not applicable   Support Person              [redacted] weeks gestation of pregnancy 06/13/2017 by Conard NovakJackson, Stephen D, MD 07/11/2017 by Oswaldo ConroySchmid, Jacelyn Y, CNM       Gestational age appropriate obstetric precautions including but not limited to vaginal bleeding, contractions, leaking of fluid and fetal movement were reviewed in detail with the patient.   - is having some SUI which may contribute to vaginal/vulvar irritation.  Wet mount negative - Rx Z-pack  Return in about 1 week (around 01/23/2018) for ROB.  Vena AustriaAndreas Chere Babson, MD, Evern CoreFACOG [facility], [facility] 01/16/2018, 3:02 PM

## 2018-01-23 ENCOUNTER — Ambulatory Visit (INDEPENDENT_AMBULATORY_CARE_PROVIDER_SITE_OTHER): Payer: Medicaid Other | Admitting: Obstetrics and Gynecology

## 2018-01-23 VITALS — BP 130/80 | Wt 221.0 lb

## 2018-01-23 DIAGNOSIS — Z3A39 39 weeks gestation of pregnancy: Secondary | ICD-10-CM

## 2018-01-23 DIAGNOSIS — O9989 Other specified diseases and conditions complicating pregnancy, childbirth and the puerperium: Secondary | ICD-10-CM

## 2018-01-23 DIAGNOSIS — O99213 Obesity complicating pregnancy, third trimester: Secondary | ICD-10-CM

## 2018-01-23 DIAGNOSIS — R8271 Bacteriuria: Secondary | ICD-10-CM

## 2018-01-23 DIAGNOSIS — Z3400 Encounter for supervision of normal first pregnancy, unspecified trimester: Secondary | ICD-10-CM

## 2018-01-23 NOTE — Progress Notes (Signed)
ROB Still has cough

## 2018-01-23 NOTE — Progress Notes (Signed)
    Routine Prenatal Care Visit  Subjective  Grace Griffin is a 22 y.o. G1P0000 at 4346w0d being seen today for ongoing prenatal care.  She is currently monitored for the following issues for this low-risk pregnancy and has Supervision of normal first pregnancy, antepartum; BMI 34.0-34.9,adult; GBS bacteriuria; Obesity complicating pregnancy; and Abdominal pain affecting pregnancy on their problem list.  ----------------------------------------------------------------------------------- Patient reports no complaints.   Contractions: Irregular. Vag. Bleeding: None.  Movement: Present. Denies leaking of fluid.  ----------------------------------------------------------------------------------- The following portions of the patient's history were reviewed and updated as appropriate: allergies, current medications, past family history, past medical history, past social history, past surgical history and problem list. Problem list updated.   Objective  Blood pressure 130/80, weight 221 lb (100.2 kg), last menstrual period 04/25/2017. Pregravid weight 180 lb (81.6 kg) Total Weight Gain 41 lb (18.6 kg) Urinalysis:      Fetal Status: Fetal Heart Rate (bpm): 140 Fundal Height: 38 cm Movement: Present  Presentation: Vertex  General:  Alert, oriented and cooperative. Patient is in no acute distress.  Skin: Skin is warm and dry. No rash noted.   Cardiovascular: Normal heart rate noted  Respiratory: Normal respiratory effort, no problems with respiration noted  Abdomen: Soft, gravid, appropriate for gestational age. Pain/Pressure: Absent     Pelvic:  Cervical exam performed Dilation: 3.5 Effacement (%): 70 Station: -2  Extremities: Normal range of motion.  Edema: None  ental Status: Normal mood and affect. Normal behavior. Normal judgment and thought content.     Assessment   22 y.o. G1P0000 at 1246w0d by  01/30/2018, by Last Menstrual Period presenting for routine prenatal visit  Plan   FIRST  Problems (from 06/07/17 to present)    Problem Noted Resolved   Obesity complicating pregnancy 11/18/2017 by Conard NovakJackson, Stephen D, MD No   GBS bacteriuria 06/13/2017 by Oswaldo ConroySchmid, Jacelyn Y, CNM No   Supervision of normal first pregnancy, antepartum 06/07/2017 by Oswaldo ConroySchmid, Jacelyn Y, CNM No   Overview Addendum 01/16/2018  2:38 PM by Vena AustriaStaebler, Pauleen Goleman, MD    [facility] Prenatal Labs  Dating  LMP = 6wk US Blood type: O/Positive/-- (01/14 1130)   Genetic Screen 1 Screen: Negative Antibody:Negative (01/14 1130)  Anatomic US complete Rubella: 5.78 (01/14 1130) Varicella: Immune  GTT Early: 63 Third trimester: 160 3-hr: 91, 105, 71, 72 RPR: Non Reactive (01/14 1130)   Rhogam  not needed HBsAg: Negative (01/14 1130)   TDaP vaccine 12/02/17 Flu Shot: HIV: Non Reactive (01/14 1130)   Baby Food   Breast                             GBS: Bacteriuria in 1st trimester  Contraception Given information Pap: Under 21  CBB  Given Information   CS/VBAC Not applicable   Support Person              [redacted] weeks gestation of pregnancy 06/13/2017 by Conard NovakJackson, Stephen D, MD 07/11/2017 by Oswaldo ConroySchmid, Jacelyn Y, CNM       Gestational age appropriate obstetric precautions including but not limited to vaginal bleeding, contractions, leaking of fluid and fetal movement were reviewed in detail with the patient.    Return in about 1 week (around 01/30/2018) for ROB.  Vena AustriaAndreas Jamyia Fortune, MD, Evern CoreFACOG [facility], [facility] 01/23/2018, 2:11 PM

## 2018-02-01 ENCOUNTER — Ambulatory Visit (INDEPENDENT_AMBULATORY_CARE_PROVIDER_SITE_OTHER): Payer: Medicaid Other | Admitting: Obstetrics and Gynecology

## 2018-02-01 VITALS — BP 136/82 | Wt 220.0 lb

## 2018-02-01 DIAGNOSIS — R8271 Bacteriuria: Secondary | ICD-10-CM

## 2018-02-01 DIAGNOSIS — O9989 Other specified diseases and conditions complicating pregnancy, childbirth and the puerperium: Secondary | ICD-10-CM

## 2018-02-01 DIAGNOSIS — O99213 Obesity complicating pregnancy, third trimester: Secondary | ICD-10-CM

## 2018-02-01 DIAGNOSIS — Z3400 Encounter for supervision of normal first pregnancy, unspecified trimester: Secondary | ICD-10-CM

## 2018-02-01 DIAGNOSIS — Z3A40 40 weeks gestation of pregnancy: Secondary | ICD-10-CM

## 2018-02-01 NOTE — Progress Notes (Signed)
  [place] [facility] INDUCTION ASSESSMENT SCHEDULING Grace Griffin 1996-05-17 Medical record #: [id number] Phone #:  Home Phone [phone]  Mobile [phone]    Prenatal Provider:[facility] Delivering Group[facility] Proposed admission date/time:02/03/18 Method of induction:Pitocin  Weight: Filed Weights09/04/19 1114Weight:220 lb (99.8 kg) BMI Body mass index is 38.97 kg/m. HIV Negative HSV Negative EDC Estimated Date of Delivery: 9/2/19based on:LMP  Gestational age on admission: [redacted]w[redacted]d Gravidity/parity:G1P0000  Cervix Score   0 1 2 3   Position Posterior Midposition Anterior   Consistency Firm Medium Soft   Effacement (%) 0-30 40-50 60-70 >80  Dilation (cm) Closed 1-2 3-4 >5  Baby's station -3 -2 -1 +1, +2   Bishop Score:8   Medical induction of labor  select indication(s) below Elective induction ?39 weeks multiparous patient ?39 weeks primiparous patient with Bishop score ?7 ?40 weeks primiparous patient   Medical Indications Adapted from ACOG Committee Opinion #[id number], "Medically Indicated Late Preterm and Early Term Deliveries," 2013.  PLACENTAL / UTERINE ISSUES FETAL ISSUES MATERNAL ISSUES  ? 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 Isoimmunization (37.0-38.6) ? Preeclampsia without severe features or gestational HTN (37.0)  ? Suspected accreta (34.0-35.6) ? Growth Restriction [name] ? Preeclampsia with severe features (34.0)  ? Prior classical CD, uterine window, rupture (36.0-37.6) ? Isolated (38.0-39.6) ? Chronic HTN (38.0-39.6)  ? Prior myomectomy (37.0-38.6) ? Concurrent findings (34.0-37.6) ? Cholestasis (37.0)  ? Umbilical vein varix (37.0) ? Growth Restriction (Twins) ? Diabetes  ? Placental abruption (chronic) ? Di-Di Isolated (36.0-37.6) ? Pregestational, controlled (39.0)  OBSTETRIC ISSUES ? Di-Di concurrent findings (32.0-34.6) ? Pregestational, uncontrolled (37.0-39.0)  ? Postdates ? (41 weeks) ? Mo-Di isolated (32.0-34.6) ? Pregestational, vascular  compromise (37.0- 39.0)  ? PPROM (34.0) ? Multiple Gestation ? Gestational, diet controlled (40.0)  ? Hx of IUFD (39.0 weeks) ? Di-Di (38.0-38.6) ? Gestational, med controlled (39.0)  ? Polyhydramnios, mild/moderate; SDV 8-16 or AFI 25-35 (39.0) ? Mo-Di (36.0-37.6) ? Gestational, uncontrolled (38.0-39.0)  ? Oligohydramnios (36.0-37.6); MVP <2 cm  For indications not listed above, delivery recommendations from maternal-fetal medicine consultant occurred on:N.A  Provider Signature: Vena Austria Scheduled by:AMS Date:02/01/2018 1:20 PM   Call [phone] to finalize the induction date/time  [id number] (07/17)

## 2018-02-01 NOTE — Progress Notes (Signed)
Obstetric H&P   Chief Complaint: ROB  Prenatal Care Provider: [facility]  History of Present Illness: 21 y.o. G1P0000 [redacted]w[redacted]d by 01/30/2018, by Last Menstrual Period = 6 week [place] presenting for routine prenatal care visit and to discuss induction of labor.  +FM, no LOF, no VB, irregular contractions.  Was seen at [facility] with cervix stable at 4cm and discharged.    Pregravid weight 180 lb (81.6 kg) Total Weight Gain 40 lb (18.1 kg)  FIRST Problems (from 06/07/17 to present)    Problem Noted Resolved   Obesity complicating pregnancy 11/18/2017 by Conard Novak, MD No   GBS bacteriuria 06/13/2017 by Oswaldo Conroy, CNM No   Supervision of normal first pregnancy, antepartum 06/07/2017 by Oswaldo Conroy, CNM No   Overview Addendum 01/16/2018  2:38 PM by Vena Austria, MD    [facility] Prenatal Labs  Dating  LMP = 6wk [place] Blood type: O/Positive/-- (01/14 1130)   Genetic Screen 1 Screen: Negative Antibody:Negative (01/14 1130)  Anatomic [place] complete Rubella: 5.78 (01/14 1130) Varicella: Immune  GTT Early: 63 Third trimester: 160 3-hr: 91, 105, 71, 72 RPR: Non Reactive (01/14 1130)   Rhogam  not needed HBsAg: Negative (01/14 1130)   TDaP vaccine 12/02/17 Flu Shot: HIV: Non Reactive (01/14 1130)   Baby Food   Breast                             GBS: Bacteriuria in 1st trimester  Contraception Given information Pap: Under 21  CBB  Given Information   CS/VBAC Not applicable   Support Person              [redacted] weeks gestation of pregnancy 06/13/2017 by Conard Novak, MD 07/11/2017 by Oswaldo Conroy, CNM       Review of Systems: 10 point review of systems negative unless otherwise noted in HPI  Past Medical History: Past Medical History:  Diagnosis Date  . Medical history non-contributory   . No known health problems     Past Surgical History: Past Surgical History:  Procedure Laterality Date  . TONSILLECTOMY     AND ADENOIDECTOMY  . WISDOM TOOTH EXTRACTION      Past  Obstetric History: #: 1, Date: None, Sex: None, Weight: None, GA: None, Delivery: None, Apgar1: None, Apgar5: None, Living: None, Birth Comments: None   Past Gynecologic History:  Family History: Family History  Problem Relation Age of Onset  . Breast cancer Paternal Grandmother   . Diabetes Father     Social History: Social History   Socioeconomic History  . Marital status: Single    Spouse name: Not on file  . Number of children: Not on file  . Years of education: Not on file  . Highest education level: Not on file  Occupational History  . Not on file  Social Needs  . Financial resource strain: Not on file  . Food insecurity:    Worry: Not on file    Inability: Not on file  . Transportation needs:    Medical: Not on file    Non-medical: Not on file  Tobacco Use  . Smoking status: Never Smoker  . Smokeless tobacco: Never Used  Substance and Sexual Activity  . Alcohol use: No  . Drug use: No  . Sexual activity: Yes  Lifestyle  . Physical activity:    Days per week: Not on file    Minutes per session: Not  on file  . Stress: Not on file  Relationships  . Social connections:    Talks on phone: Not on file    Gets together: Not on file    Attends religious service: Not on file    Active member of club or organization: Not on file    Attends meetings of clubs or organizations: Not on file    Relationship status: Not on file  . Intimate partner violence:    Fear of current or ex partner: Not on file    Emotionally abused: Not on file    Physically abused: Not on file    Forced sexual activity: Not on file  Other Topics Concern  . Not on file  Social History Narrative  . Not on file    Medications: Prior to Admission medications   Medication Sig Start Date End Date Taking? Authorizing Provider  Prenatal Vit-Fe Fumarate-FA (PRENATAL MULTIVITAMIN) TABS tablet Take 1 tablet by mouth daily at 12 noon.   Yes [provider]    Allergies: No Known  Allergies  Physical Exam: Vitals: Blood pressure 136/82, weight 220 lb (99.8 kg), last menstrual period 04/25/2017.  Urine Dip Protein: neg  FHT:130  General: NAD HEENT: normocephalic, anicteric Pulmonary: No increased work of breathing Cardiovascular: RRR, distal pulses 2+ Abdomen: Gravid, non-tender Leopolds:vtx Genitourinary: 4/70/-2 Extremities: no edema, erythema, or tenderness Neurologic: Grossly intact Psychiatric: mood appropriate, affect full  Labs: No results found for this or any previous visit (from the past 24 hour(s)).  Assessment: 22 y.o. G1P0000 [redacted]w[redacted]d by 01/30/2018, by Last Menstrual Period = 6 week [place]  Plan: 1) Scheduled for IOL 02/03/2018  2) Fetus - +FHT  3) PNL - Blood type --/--/O POS Performed at [facility], [address] [place], [place] [address]  [phone]/07 0056) / Anti-bodyscreen Negative (01/14 1130) / Rubella 5.78 (01/14 1130) / Varicella Immune / RPR Non Reactive (06/10 0949) / HBsAg Negative (01/14 1130) / HIV Non Reactive (06/10 0949) / 1-hr OGTT 160, with normal 3-hr / GBS Positive (08/05 1529)  4) Immunization History -  Immunization History  Administered Date(s) Administered  . Tdap 12/02/2017    5) Disposition - pending delivery  Vena Austria, MD, [facility], [facility] 02/01/2018, 1:12 PM

## 2018-02-01 NOTE — Progress Notes (Signed)
ROB

## 2018-02-03 ENCOUNTER — Other Ambulatory Visit: Payer: Self-pay

## 2018-02-03 ENCOUNTER — Inpatient Hospital Stay: Payer: Medicaid Other | Admitting: Anesthesiology

## 2018-02-03 ENCOUNTER — Inpatient Hospital Stay
Admission: EM | Admit: 2018-02-03 | Discharge: 2018-02-07 | DRG: 787 | Disposition: A | Discharge status: Home / Self Care | Payer: Medicaid Other | Attending: Obstetrics & Gynecology | Admitting: Obstetrics & Gynecology

## 2018-02-03 DIAGNOSIS — O99214 Obesity complicating childbirth: Secondary | ICD-10-CM | POA: Diagnosis present

## 2018-02-03 DIAGNOSIS — R8271 Bacteriuria: Secondary | ICD-10-CM

## 2018-02-03 DIAGNOSIS — O3663X0 Maternal care for excessive fetal growth, third trimester, not applicable or unspecified: Secondary | ICD-10-CM | POA: Diagnosis present

## 2018-02-03 DIAGNOSIS — O99213 Obesity complicating pregnancy, third trimester: Secondary | ICD-10-CM

## 2018-02-03 DIAGNOSIS — O99824 Streptococcus B carrier state complicating childbirth: Secondary | ICD-10-CM | POA: Diagnosis present

## 2018-02-03 DIAGNOSIS — D62 Acute posthemorrhagic anemia: Secondary | ICD-10-CM | POA: Diagnosis not present

## 2018-02-03 DIAGNOSIS — Z3400 Encounter for supervision of normal first pregnancy, unspecified trimester: Secondary | ICD-10-CM

## 2018-02-03 DIAGNOSIS — Z3A40 40 weeks gestation of pregnancy: Secondary | ICD-10-CM | POA: Diagnosis not present

## 2018-02-03 DIAGNOSIS — O9081 Anemia of the puerperium: Secondary | ICD-10-CM | POA: Diagnosis not present

## 2018-02-03 DIAGNOSIS — E669 Obesity, unspecified: Secondary | ICD-10-CM | POA: Diagnosis present

## 2018-02-03 DIAGNOSIS — O480 Post-term pregnancy: Secondary | ICD-10-CM | POA: Diagnosis present

## 2018-02-03 DIAGNOSIS — Z3490 Encounter for supervision of normal pregnancy, unspecified, unspecified trimester: Secondary | ICD-10-CM | POA: Diagnosis present

## 2018-02-03 LAB — TYPE AND SCREEN
ABO/RH(D): O POS
Antibody Screen: NEGATIVE

## 2018-02-03 LAB — CBC
HCT: 35.7 % (ref 35.0–47.0)
HEMOGLOBIN: 12 g/dL (ref 12.0–16.0)
MCH: 26.1 pg (ref 26.0–34.0)
MCHC: 33.7 g/dL (ref 32.0–36.0)
MCV: 77.5 fL — AB (ref 80.0–100.0)
PLATELETS: 198 10*3/uL (ref 150–440)
RBC: 4.61 MIL/uL (ref 3.80–5.20)
RDW: 16.6 % — ABNORMAL HIGH (ref 11.5–14.5)
WBC: 11.6 10*3/uL — ABNORMAL HIGH (ref 3.6–11.0)

## 2018-02-03 LAB — CHLAMYDIA/NGC RT PCR (ARMC ONLY)
CHLAMYDIA TR: NOT DETECTED
N gonorrhoeae: NOT DETECTED

## 2018-02-03 MED ORDER — TERBUTALINE SULFATE 1 MG/ML IJ SOLN: 0.2500 mg | Freq: Once | INTRAMUSCULAR | Status: DC | PRN

## 2018-02-03 MED ORDER — OXYTOCIN 40 UNITS IN LACTATED RINGERS INFUSION - SIMPLE MED
INTRAVENOUS | Status: AC
  Filled 2018-02-03: qty 1000

## 2018-02-03 MED ORDER — LIDOCAINE-EPINEPHRINE (PF) 1.5 %-1:200000 IJ SOLN
INTRAMUSCULAR | Status: DC | PRN
  Administered 2018-02-03: 3 mL via EPIDURAL

## 2018-02-03 MED ORDER — LACTATED RINGERS IV SOLN
500.0000 mL | Freq: Once | INTRAVENOUS | Status: AC
  Administered 2018-02-03: 500 mL via INTRAVENOUS

## 2018-02-03 MED ORDER — OXYTOCIN BOLUS FROM INFUSION: 500.0000 mL | Freq: Once | INTRAVENOUS | Status: DC

## 2018-02-03 MED ORDER — CEFAZOLIN SODIUM-DEXTROSE 2-4 GM/100ML-% IV SOLN
2.0000 g | Freq: Once | INTRAVENOUS | Status: AC
  Administered 2018-02-03: 2 g via INTRAVENOUS
  Filled 2018-02-03: qty 100

## 2018-02-03 MED ORDER — PENICILLIN G 3 MILLION UNITS IVPB - SIMPLE MED
3.0000 10*6.[IU] | INTRAVENOUS | Status: DC
  Filled 2018-02-03 (×4): qty 100

## 2018-02-03 MED ORDER — FENTANYL 2.5 MCG/ML W/ROPIVACAINE 0.15% IN NS 100 ML EPIDURAL (ARMC)
EPIDURAL | Status: AC
  Filled 2018-02-03: qty 100

## 2018-02-03 MED ORDER — OXYTOCIN 40 UNITS IN LACTATED RINGERS INFUSION - SIMPLE MED
2.5000 [IU]/h | INTRAVENOUS | Status: DC
  Administered 2018-02-04: 2.5 [IU]/h via INTRAVENOUS
  Filled 2018-02-03: qty 1000

## 2018-02-03 MED ORDER — FAMOTIDINE 20 MG PO TABS
20.0000 mg | ORAL_TABLET | Freq: Two times a day (BID) | ORAL | Status: DC | PRN
  Administered 2018-02-03 – 2018-02-04 (×2): 20 mg via ORAL
  Filled 2018-02-03: qty 1

## 2018-02-03 MED ORDER — MISOPROSTOL 200 MCG PO TABS
800.0000 ug | ORAL_TABLET | Freq: Once | ORAL | Status: DC | PRN
  Filled 2018-02-03: qty 4

## 2018-02-03 MED ORDER — LIDOCAINE HCL (PF) 1 % IJ SOLN
INTRAMUSCULAR | Status: DC | PRN
  Administered 2018-02-03: 2 mL via SUBCUTANEOUS

## 2018-02-03 MED ORDER — EPHEDRINE 5 MG/ML INJ: 10.0000 mg | INTRAVENOUS | Status: DC | PRN

## 2018-02-03 MED ORDER — PHENYLEPHRINE 40 MCG/ML (10ML) SYRINGE FOR IV PUSH (FOR BLOOD PRESSURE SUPPORT): 80.0000 ug | PREFILLED_SYRINGE | INTRAVENOUS | Status: DC | PRN

## 2018-02-03 MED ORDER — SOD CITRATE-CITRIC ACID 500-334 MG/5ML PO SOLN
ORAL | Status: AC
  Filled 2018-02-03: qty 15

## 2018-02-03 MED ORDER — BUTORPHANOL TARTRATE 2 MG/ML IJ SOLN: 1.0000 mg | INTRAMUSCULAR | Status: DC | PRN

## 2018-02-03 MED ORDER — OXYTOCIN 40 UNITS IN LACTATED RINGERS INFUSION - SIMPLE MED
1.0000 m[IU]/min | INTRAVENOUS | Status: DC
  Administered 2018-02-03: 2 m[IU]/min via INTRAVENOUS

## 2018-02-03 MED ORDER — LACTATED RINGERS IV SOLN
INTRAVENOUS | Status: DC
  Administered 2018-02-03 (×3): via INTRAVENOUS

## 2018-02-03 MED ORDER — FENTANYL 2.5 MCG/ML W/ROPIVACAINE 0.15% IN NS 100 ML EPIDURAL (ARMC)
12.0000 mL/h | EPIDURAL | Status: DC
  Administered 2018-02-03 – 2018-02-04 (×2): 12 mL/h via EPIDURAL
  Filled 2018-02-03: qty 100

## 2018-02-03 MED ORDER — SODIUM CHLORIDE 0.9 % IV SOLN
INTRAVENOUS | Status: DC | PRN
  Administered 2018-02-03 (×2): 5 mL via EPIDURAL

## 2018-02-03 MED ORDER — ONDANSETRON HCL 4 MG/2ML IJ SOLN
4.0000 mg | Freq: Four times a day (QID) | INTRAMUSCULAR | Status: DC | PRN
  Administered 2018-02-03 – 2018-02-04 (×2): 4 mg via INTRAVENOUS
  Filled 2018-02-03 (×2): qty 2

## 2018-02-03 MED ORDER — LIDOCAINE HCL (PF) 1 % IJ SOLN
30.0000 mL | INTRAMUSCULAR | Status: DC | PRN
  Filled 2018-02-03: qty 30

## 2018-02-03 MED ORDER — AMMONIA AROMATIC IN INHA
0.3000 mL | Freq: Once | RESPIRATORY_TRACT | Status: DC | PRN
  Filled 2018-02-03: qty 10

## 2018-02-03 MED ORDER — CEFAZOLIN SODIUM-DEXTROSE 1-4 GM/50ML-% IV SOLN
1.0000 g | Freq: Three times a day (TID) | INTRAVENOUS | Status: DC
  Administered 2018-02-03: 1 g via INTRAVENOUS
  Filled 2018-02-03 (×2): qty 50

## 2018-02-03 MED ORDER — FAMOTIDINE 20 MG PO TABS
ORAL_TABLET | ORAL | Status: AC
  Administered 2018-02-03: 20 mg via ORAL
  Filled 2018-02-03: qty 1

## 2018-02-03 MED ORDER — SODIUM CHLORIDE 0.9 % IV SOLN: 5.0000 10*6.[IU] | Freq: Once | INTRAVENOUS | Status: DC

## 2018-02-03 MED ORDER — LACTATED RINGERS IV SOLN: 500.0000 mL | INTRAVENOUS | Status: DC | PRN

## 2018-02-03 MED ORDER — DIPHENHYDRAMINE HCL 50 MG/ML IJ SOLN: 12.5000 mg | INTRAMUSCULAR | Status: DC | PRN

## 2018-02-03 NOTE — Progress Notes (Signed)
Date of Initial H&P: 02/01/2018  History reviewed, patient examined, no change in status, stable for surgery.   22 y.o. G1P0000 [redacted]w[redacted]d with EDC 01/30/2018, by Last Menstrual Period = 6 who present for induction of labor. Pregnancy has been complicated by obesity (current BMI 38.97 kg/m2) and GBS bacteruria. Had an elevated 28 week glucola with a normal 3 hour GTT. Has gained 40# with the pregnancy. EFW at 34 weeks was 6# (71.9th%) Prenatal care at [facility] also remarkable for: [facility] Prenatal Labs  Dating  LMP = 6wk [place] Blood type: O/Positive/-- (01/14 1130)   Genetic Screen 1 Screen: Negative Antibody:Negative (01/14 1130)  Anatomic [place] complete Rubella: 5.78 (01/14 1130) Varicella: Immune  GTT Early: 63 Third trimester: 160 3-hr: 91, 105, 71, 72 RPR: Non Reactive (01/14 1130)   Rhogam  not needed HBsAg: Negative (01/14 1130)   TDaP vaccine 12/02/17 Flu Shot: HIV: Non Reactive (01/14 1130)   Baby Food   Breast                             GBS: Bacteriuria in 1st trimester  Contraception Given information Pap: Under 21  CBB  Given Information   CS/VBAC Not applicable   Support Person          Exam: BP 136/82 (BP Location: Left Arm)   Pulse 99   Temp 98.4 F (36.9 C) (Oral)   Resp 16   Ht 5\' 3"  (1.6 m)   Wt 98 kg   LMP 04/25/2017   SpO2 100%   BMI 38.26 kg/m   General WF in NAD, but anxious Abdomen: Cephalic ?OP, EFW: 9#  FHR: 150 baseline with accelerations to 160s to180, moderate variability. ? Two possible decelerations to 120s to 130s, with moderate to marked variability, no recurrent decelerations Toco: irregular, occasional  Cervix: 4/75%/-1  A: IUP at 40wk4d for IOL LGA baby Bishop score 10 FWB: Reassuring GBS positive  P: Kefzol for GBS coverage Start Pitocin at 1300 (2 hours after Kefzol)-discussed risks of hyperstimulation, FITL, FTP, Cesarean section. Patient wishes to proceed. Plan AROM after at least 4 hours after Kefzol Epidural  when patient desires  Farrel Conners, CNM

## 2018-02-03 NOTE — Progress Notes (Signed)
L&D Progress Note   S: Not really feeling her contractions when she is lying on her side.   O: BP 140/80   Pulse 90   Temp 98.1 F (36.7 C) (Oral)   Resp 16   Ht 5\' 3"  (1.6 m)   Wt 98 kg   LMP 04/25/2017   SpO2 100%   BMI 38.26 kg/m    General:  FHR: change of baseline to 130s with multiple accelerations, moderate to marked variability, with occasional variable decelerations which can be late in onset Toco: contractions every 2-7 min apart with some coupling  Ultrasound: LOT/LOP. AFI: one pocket of 7.5cm in the RUQ  A: IUP at 40wk4days in process of induction Adequate coverage of GBS now (has been 5 hours since Kefzol) Reactive fetal heart tracing with occasional variable deceleration  P: Position changes have been resolving decelerations which seem to occur more often when patient is sitting up. Continue Pitocin induction-monitor fetal well being Consider AROM when contracting more frequently  Farrel Conners, CNM

## 2018-02-03 NOTE — Progress Notes (Signed)
L&D progress Note  S: Still fairly comfortable with contractions  O:BP 140/80   Pulse 90   Temp 97.8 F (36.6 C) (Oral)   Resp 16   Ht 5\' 3"  (1.6 m)   Wt 98 kg   LMP 04/25/2017   SpO2 100%   BMI 38.26 kg/m    General: appears in NAD FHR: baseline wanders between 130s and 145-150 with multiple accelerations and occasional variable decelerations, moderate variability  Toco: contractions every 2-4 min apart on 8 miu/min Pitocin  Cervix: 4/80%// -1  A: IOL in progress Cat 1 tracing most of the time, occasional Cat2  P: AROM small amount moderate meconium stained amniotic fluid IUPC inserted FSE applied  Epidural if desires Consider amnioinfusion for persistent variable deceleration Titrate Pitocin to 200-250 mvus.  Farrel Conners, CNM

## 2018-02-03 NOTE — Anesthesia Procedure Notes (Signed)
Epidural Patient location during procedure: OB Start time: 02/03/2018 5:28 PM End time: 02/03/2018 5:43 PM  Staffing Anesthesiologist: Lenard Simmer, MD Performed: anesthesiologist   Preanesthetic Checklist Completed: patient identified, site marked, surgical consent, pre-op evaluation, timeout performed, IV checked, risks and benefits discussed and monitors and equipment checked  Epidural Patient position: sitting Prep: ChloraPrep Patient monitoring: heart rate, continuous pulse ox and blood pressure Approach: midline Location: L3-L4 Injection technique: LOR saline  Needle:  Needle type: Tuohy  Needle gauge: 17 G Needle length: 9 cm and 9 Needle insertion depth: 6 cm Catheter type: closed end flexible Catheter size: 19 Gauge Catheter at skin depth: 11 cm Test dose: negative and 1.5% lidocaine with Epi 1:200 K  Assessment Sensory level: T10 Events: blood not aspirated, injection not painful, no injection resistance, negative IV test and no paresthesia  Additional Notes 1st attempt Pt. Evaluated and documentation done after procedure finished. Patient identified. Risks/Benefits/Options discussed with patient including but not limited to bleeding, infection, nerve damage, paralysis, failed block, incomplete pain control, headache, blood pressure changes, nausea, vomiting, reactions to medication both or allergic, itching and postpartum back pain. Confirmed with bedside nurse the patient's most recent platelet count. Confirmed with patient that they are not currently taking any anticoagulation, have any bleeding history or any family history of bleeding disorders. Patient expressed understanding and wished to proceed. All questions were answered. Sterile technique was used throughout the entire procedure. Please see nursing notes for vital signs. Test dose was given through epidural catheter and negative prior to continuing to dose epidural or start infusion. Warning signs of high  block given to the patient including shortness of breath, tingling/numbness in hands, complete motor block, or any concerning symptoms with instructions to call for help. Patient was given instructions on fall risk and not to get out of bed. All questions and concerns addressed with instructions to call with any issues or inadequate analgesia.   Patient tolerated the insertion well without immediate complications.Reason for block:procedure for pain

## 2018-02-03 NOTE — Anesthesia Preprocedure Evaluation (Signed)
Anesthesia Evaluation  Patient identified by MRN, date of birth, ID band Patient awake    Reviewed: Allergy & Precautions, H&P , NPO status , Patient's Chart, lab work & pertinent test results, reviewed documented beta blocker date and time   History of Anesthesia Complications Negative for: history of anesthetic complications  Airway Mallampati: I  TM Distance: >3 FB Neck ROM: full    Dental  (+) Dental Advidsory Given, Teeth Intact   Pulmonary neg pulmonary ROS,           Cardiovascular Exercise Tolerance: Good negative cardio ROS       Neuro/Psych negative neurological ROS  negative psych ROS   GI/Hepatic Neg liver ROS, GERD  ,  Endo/Other  negative endocrine ROS  Renal/GU negative Renal ROS  negative genitourinary   Musculoskeletal   Abdominal   Peds  Hematology negative hematology ROS (+)   Anesthesia Other Findings Past Medical History: No date: Medical history non-contributory No date: No known health problems   Reproductive/Obstetrics (+) Pregnancy                             Anesthesia Physical Anesthesia Plan  ASA: II  Anesthesia Plan: Epidural   Post-op Pain Management:    Induction:   PONV Risk Score and Plan:   Airway Management Planned:   Additional Equipment:   Intra-op Plan:   Post-operative Plan:   Informed Consent: I have reviewed the patients History and Physical, chart, labs and discussed the procedure including the risks, benefits and alternatives for the proposed anesthesia with the patient or authorized representative who has indicated his/her understanding and acceptance.   Dental Advisory Given  Plan Discussed with: Anesthesiologist, CRNA and Surgeon  Anesthesia Plan Comments:         Anesthesia Quick Evaluation

## 2018-02-04 ENCOUNTER — Encounter
Admission: EM | Disposition: A | Discharge status: Home / Self Care | Payer: Self-pay | Source: Home / Self Care | Attending: Obstetrics & Gynecology

## 2018-02-04 DIAGNOSIS — O99824 Streptococcus B carrier state complicating childbirth: Secondary | ICD-10-CM

## 2018-02-04 DIAGNOSIS — Z3A40 40 weeks gestation of pregnancy: Secondary | ICD-10-CM

## 2018-02-04 LAB — RPR: RPR Ser Ql: NONREACTIVE

## 2018-02-04 LAB — CBC
HCT: 30.3 % — ABNORMAL LOW (ref 35.0–47.0)
HEMOGLOBIN: 10.1 g/dL — AB (ref 12.0–16.0)
MCH: 25.7 pg — AB (ref 26.0–34.0)
MCHC: 33.3 g/dL (ref 32.0–36.0)
MCV: 77.4 fL — ABNORMAL LOW (ref 80.0–100.0)
PLATELETS: 159 10*3/uL (ref 150–440)
RBC: 3.92 MIL/uL (ref 3.80–5.20)
RDW: 16.6 % — ABNORMAL HIGH (ref 11.5–14.5)
WBC: 15 10*3/uL — AB (ref 3.6–11.0)

## 2018-02-04 SURGERY — Surgical Case
Anesthesia: Choice | Wound class: Clean Contaminated

## 2018-02-04 MED ORDER — SOD CITRATE-CITRIC ACID 500-334 MG/5ML PO SOLN
ORAL | Status: AC
  Filled 2018-02-04: qty 15

## 2018-02-04 MED ORDER — KETOROLAC TROMETHAMINE 30 MG/ML IJ SOLN
30.0000 mg | Freq: Four times a day (QID) | INTRAMUSCULAR | Status: AC
  Administered 2018-02-04 – 2018-02-05 (×3): 30 mg via INTRAVENOUS
  Filled 2018-02-04 (×3): qty 1

## 2018-02-04 MED ORDER — OXYTOCIN 40 UNITS IN LACTATED RINGERS INFUSION - SIMPLE MED
INTRAVENOUS | Status: AC
  Filled 2018-02-04: qty 1000

## 2018-02-04 MED ORDER — FENTANYL CITRATE (PF) 100 MCG/2ML IJ SOLN
INTRAMUSCULAR | Status: AC
  Filled 2018-02-04: qty 2

## 2018-02-04 MED ORDER — HYDROMORPHONE HCL 1 MG/ML IJ SOLN
INTRAMUSCULAR | Status: AC
  Filled 2018-02-04: qty 1

## 2018-02-04 MED ORDER — IBUPROFEN 600 MG PO TABS
600.0000 mg | ORAL_TABLET | Freq: Four times a day (QID) | ORAL | Status: DC
  Administered 2018-02-05 – 2018-02-07 (×8): 600 mg via ORAL
  Filled 2018-02-04 (×9): qty 1

## 2018-02-04 MED ORDER — SENNOSIDES-DOCUSATE SODIUM 8.6-50 MG PO TABS
2.0000 | ORAL_TABLET | ORAL | Status: DC
  Administered 2018-02-05 – 2018-02-07 (×3): 2 via ORAL
  Filled 2018-02-04 (×3): qty 2

## 2018-02-04 MED ORDER — SIMETHICONE 80 MG PO CHEW: 80.0000 mg | CHEWABLE_TABLET | ORAL | Status: DC | PRN

## 2018-02-04 MED ORDER — ONDANSETRON HCL 4 MG/2ML IJ SOLN: 4.0000 mg | Freq: Three times a day (TID) | INTRAMUSCULAR | Status: DC | PRN

## 2018-02-04 MED ORDER — NALBUPHINE HCL 10 MG/ML IJ SOLN: 5.0000 mg | INTRAMUSCULAR | Status: DC | PRN

## 2018-02-04 MED ORDER — BUPIVACAINE HCL 0.5 % IJ SOLN
INTRAMUSCULAR | Status: DC | PRN
  Administered 2018-02-04 (×2): 10 mL

## 2018-02-04 MED ORDER — ONDANSETRON HCL 4 MG/2ML IJ SOLN
INTRAMUSCULAR | Status: DC | PRN
  Administered 2018-02-04: 4 mg via INTRAVENOUS

## 2018-02-04 MED ORDER — DIPHENHYDRAMINE HCL 25 MG PO CAPS: 25.0000 mg | ORAL_CAPSULE | Freq: Four times a day (QID) | ORAL | Status: DC | PRN

## 2018-02-04 MED ORDER — OXYCODONE-ACETAMINOPHEN 5-325 MG PO TABS: 2.0000 | ORAL_TABLET | ORAL | Status: DC | PRN

## 2018-02-04 MED ORDER — COCONUT OIL OIL
1.0000 "application " | TOPICAL_OIL | Status: DC | PRN
  Administered 2018-02-04: 1 via TOPICAL
  Filled 2018-02-04: qty 120

## 2018-02-04 MED ORDER — DIPHENHYDRAMINE HCL 50 MG/ML IJ SOLN: 12.5000 mg | INTRAMUSCULAR | Status: DC | PRN

## 2018-02-04 MED ORDER — ONDANSETRON HCL 4 MG/2ML IJ SOLN: 4.0000 mg | Freq: Once | INTRAMUSCULAR | Status: DC | PRN

## 2018-02-04 MED ORDER — FENTANYL CITRATE (PF) 100 MCG/2ML IJ SOLN: 25.0000 ug | INTRAMUSCULAR | Status: DC | PRN

## 2018-02-04 MED ORDER — WITCH HAZEL-GLYCERIN EX PADS: 1.0000 "application " | MEDICATED_PAD | CUTANEOUS | Status: DC | PRN

## 2018-02-04 MED ORDER — SODIUM CHLORIDE 0.9% FLUSH: 3.0000 mL | INTRAVENOUS | Status: DC | PRN

## 2018-02-04 MED ORDER — OXYCODONE HCL 5 MG PO TABS
5.0000 mg | ORAL_TABLET | ORAL | Status: AC | PRN
  Administered 2018-02-04 (×2): 5 mg via ORAL
  Filled 2018-02-04 (×2): qty 1

## 2018-02-04 MED ORDER — NALBUPHINE HCL 10 MG/ML IJ SOLN: 5.0000 mg | Freq: Once | INTRAMUSCULAR | Status: DC | PRN

## 2018-02-04 MED ORDER — HYDROMORPHONE HCL 1 MG/ML IJ SOLN
INTRAMUSCULAR | Status: DC | PRN
  Administered 2018-02-04: 0.5 mg via INTRAVENOUS

## 2018-02-04 MED ORDER — BUPIVACAINE 0.25 % ON-Q PUMP DUAL CATH 400 ML
400.0000 mL | INJECTION | Status: DC
  Filled 2018-02-04: qty 400

## 2018-02-04 MED ORDER — SIMETHICONE 80 MG PO CHEW: 80.0000 mg | CHEWABLE_TABLET | ORAL | Status: DC

## 2018-02-04 MED ORDER — LACTATED RINGERS IV SOLN: INTRAVENOUS | Status: DC

## 2018-02-04 MED ORDER — MEPERIDINE HCL 25 MG/ML IJ SOLN
6.2500 mg | INTRAMUSCULAR | Status: DC | PRN
  Filled 2018-02-04: qty 1

## 2018-02-04 MED ORDER — NALOXONE HCL 0.4 MG/ML IJ SOLN: 0.4000 mg | INTRAMUSCULAR | Status: DC | PRN

## 2018-02-04 MED ORDER — KETOROLAC TROMETHAMINE 30 MG/ML IJ SOLN
30.0000 mg | Freq: Four times a day (QID) | INTRAMUSCULAR | Status: DC | PRN
  Administered 2018-02-04: 30 mg via INTRAVENOUS
  Filled 2018-02-04: qty 1

## 2018-02-04 MED ORDER — DIPHENHYDRAMINE HCL 25 MG PO CAPS: 25.0000 mg | ORAL_CAPSULE | ORAL | Status: DC | PRN

## 2018-02-04 MED ORDER — DIBUCAINE 1 % RE OINT: 1.0000 "application " | TOPICAL_OINTMENT | RECTAL | Status: DC | PRN

## 2018-02-04 MED ORDER — SODIUM CHLORIDE 0.9 % IV SOLN
2.0000 g | INTRAVENOUS | Status: AC
  Administered 2018-02-04: 2 g via INTRAVENOUS
  Filled 2018-02-04: qty 2

## 2018-02-04 MED ORDER — ACETAMINOPHEN 325 MG PO TABS
650.0000 mg | ORAL_TABLET | ORAL | Status: DC | PRN
  Administered 2018-02-04: 325 mg via ORAL
  Administered 2018-02-06 – 2018-02-07 (×2): 650 mg via ORAL
  Filled 2018-02-04 (×5): qty 2

## 2018-02-04 MED ORDER — TETANUS-DIPHTH-ACELL PERTUSSIS 5-2.5-18.5 LF-MCG/0.5 IM SUSP: 0.5000 mL | Freq: Once | INTRAMUSCULAR | Status: DC

## 2018-02-04 MED ORDER — KETOROLAC TROMETHAMINE 30 MG/ML IJ SOLN: 30.0000 mg | Freq: Four times a day (QID) | INTRAMUSCULAR | Status: DC | PRN

## 2018-02-04 MED ORDER — SOD CITRATE-CITRIC ACID 500-334 MG/5ML PO SOLN: 30.0000 mL | ORAL | Status: DC

## 2018-02-04 MED ORDER — NALOXONE HCL 4 MG/10ML IJ SOLN
1.0000 ug/kg/h | INTRAVENOUS | Status: DC | PRN
  Filled 2018-02-04: qty 5

## 2018-02-04 MED ORDER — MORPHINE SULFATE (PF) 2 MG/ML IV SOLN: 1.0000 mg | INTRAVENOUS | Status: DC | PRN

## 2018-02-04 MED ORDER — OXYCODONE-ACETAMINOPHEN 5-325 MG PO TABS
1.0000 | ORAL_TABLET | ORAL | Status: DC | PRN
  Administered 2018-02-05: 1 via ORAL
  Filled 2018-02-04: qty 1

## 2018-02-04 MED ORDER — BUPIVACAINE HCL 0.5 % IJ SOLN: 10.0000 mL | Freq: Once | INTRAMUSCULAR | Status: DC

## 2018-02-04 MED ORDER — SIMETHICONE 80 MG PO CHEW
80.0000 mg | CHEWABLE_TABLET | Freq: Three times a day (TID) | ORAL | Status: DC
  Administered 2018-02-04 – 2018-02-07 (×9): 80 mg via ORAL
  Filled 2018-02-04 (×10): qty 1

## 2018-02-04 MED ORDER — BUPIVACAINE HCL (PF) 0.5 % IJ SOLN
INTRAMUSCULAR | Status: AC
  Filled 2018-02-04: qty 30

## 2018-02-04 MED ORDER — PRENATAL MULTIVITAMIN CH
1.0000 | ORAL_TABLET | Freq: Every day | ORAL | Status: DC
  Administered 2018-02-05 – 2018-02-06 (×2): 1 via ORAL
  Filled 2018-02-04 (×4): qty 1

## 2018-02-04 MED ORDER — OXYTOCIN 40 UNITS IN LACTATED RINGERS INFUSION - SIMPLE MED
2.5000 [IU]/h | INTRAVENOUS | Status: AC
  Filled 2018-02-04: qty 1000

## 2018-02-04 MED ORDER — MENTHOL 3 MG MT LOZG
1.0000 | LOZENGE | OROMUCOSAL | Status: DC | PRN
  Filled 2018-02-04: qty 9

## 2018-02-04 MED ORDER — ZOLPIDEM TARTRATE 5 MG PO TABS: 5.0000 mg | ORAL_TABLET | Freq: Every evening | ORAL | Status: DC | PRN

## 2018-02-04 MED ORDER — FENTANYL CITRATE (PF) 100 MCG/2ML IJ SOLN
INTRAMUSCULAR | Status: DC | PRN
  Administered 2018-02-04: 25 ug via INTRAVENOUS
  Administered 2018-02-04: 50 ug via INTRAVENOUS
  Administered 2018-02-04: 75 ug via INTRAVENOUS
  Administered 2018-02-04: 50 ug via INTRAVENOUS

## 2018-02-04 MED ORDER — LIDOCAINE HCL (PF) 2 % IJ SOLN
INTRAMUSCULAR | Status: DC | PRN
  Administered 2018-02-04: 5 mg via INTRADERMAL
  Administered 2018-02-04 (×2): 2 mg via INTRADERMAL
  Administered 2018-02-04 (×2): 3 mg via INTRADERMAL
  Administered 2018-02-04: 5 mg via INTRADERMAL

## 2018-02-04 MED ORDER — OXYCODONE-ACETAMINOPHEN 5-325 MG PO TABS: 1.0000 | ORAL_TABLET | ORAL | Status: DC | PRN

## 2018-02-04 SURGICAL SUPPLY — 24 items
CANISTER SUCT 3000ML PPV (MISCELLANEOUS) ×2 IMPLANT
CATH KIT ON-Q SILVERSOAK 5IN (CATHETERS) ×4 IMPLANT
CHLORAPREP W/TINT 26ML (MISCELLANEOUS) ×4 IMPLANT
DERMABOND ADVANCED (GAUZE/BANDAGES/DRESSINGS) ×2
DERMABOND ADVANCED .7 DNX12 (GAUZE/BANDAGES/DRESSINGS) ×2 IMPLANT
DRSG OPSITE POSTOP 4X10 (GAUZE/BANDAGES/DRESSINGS) ×4 IMPLANT
ELECT CAUTERY BLADE 6.4 (BLADE) ×2 IMPLANT
ELECT REM PT RETURN 9FT ADLT (ELECTROSURGICAL) ×2
ELECTRODE REM PT RTRN 9FT ADLT (ELECTROSURGICAL) ×1 IMPLANT
GLOVE SKINSENSE NS SZ8.0 LF (GLOVE) ×1
GLOVE SKINSENSE STRL SZ8.0 LF (GLOVE) ×1 IMPLANT
GOWN STRL REUS W/ TWL LRG LVL3 (GOWN DISPOSABLE) ×1 IMPLANT
GOWN STRL REUS W/ TWL XL LVL3 (GOWN DISPOSABLE) ×2 IMPLANT
GOWN STRL REUS W/TWL LRG LVL3 (GOWN DISPOSABLE) ×1
GOWN STRL REUS W/TWL XL LVL3 (GOWN DISPOSABLE) ×2
NEEDLE HYPO 22GX1.5 SAFETY (NEEDLE) ×2 IMPLANT
NS IRRIG 1000ML POUR BTL (IV SOLUTION) ×2 IMPLANT
PACK C SECTION AR (MISCELLANEOUS) ×2 IMPLANT
PAD OB MATERNITY 4.3X12.25 (PERSONAL CARE ITEMS) ×2 IMPLANT
PAD PREP 24X41 OB/GYN DISP (PERSONAL CARE ITEMS) ×2 IMPLANT
SUT MAXON ABS #0 GS21 30IN (SUTURE) ×4 IMPLANT
SUT VIC AB 1 CT1 36 (SUTURE) ×6 IMPLANT
SUT VIC AB 2-0 CT1 36 (SUTURE) ×2 IMPLANT
SUT VIC AB 4-0 FS2 27 (SUTURE) ×2 IMPLANT

## 2018-02-04 NOTE — Progress Notes (Signed)
Obstetric Postpartum/PostOperative Daily Progress Note Subjective:  22 y.o. G1P1001 PPD#0 status post primary cesarean delivery.  She is not ambulating, is tolerating po, is not voiding spontaneously.  Her pain is well controlled on PO pain medications. Her lochia is equal to menses.   Medications SCHEDULED MEDICATIONS  . bupivacaine      . [START ON 02/05/2018] ibuprofen  600 mg Oral QID  . ketorolac  30 mg Intravenous Q6H  . prenatal multivitamin  1 tablet Oral Q1200  . [START ON 02/05/2018] senna-docusate  2 tablet Oral Q24H  . simethicone  80 mg Oral TID PC  . [START ON 02/05/2018] simethicone  80 mg Oral Q24H  . [START ON 02/05/2018] Tdap  0.5 mL Intramuscular Once    MEDICATION INFUSIONS  . bupivacaine 0.25 % ON-Q pump DUAL CATH 400 mL    . lactated ringers    . naLOXone [facility]) adult infusion for PRURITIS    . oxytocin    . oxytocin 40 units in LR 1000 mL      PRN MEDICATIONS  acetaminophen, coconut oil, witch hazel-glycerin **AND** dibucaine, diphenhydrAMINE, diphenhydrAMINE **OR** diphenhydrAMINE, fentaNYL (SUBLIMAZE) injection, menthol-cetylpyridinium, meperidine (DEMEROL) injection, morphine injection, nalbuphine **OR** nalbuphine, nalbuphine **OR** nalbuphine, naloxone **AND** sodium chloride flush, naLOXone (NARCAN) adult infusion for PRURITIS, ondansetron (ZOFRAN) IV, ondansetron (ZOFRAN) IV, oxyCODONE, [START ON 02/05/2018] oxyCODONE-acetaminophen, [START ON 02/05/2018] oxyCODONE-acetaminophen, simethicone, zolpidem    Objective:   Vitals:   02/04/18 0739 02/04/18 0849 02/04/18 0932 02/04/18 1036  BP: 125/69 119/66 126/85 125/82  Pulse: 83 95 86 99  Resp: 14 14 18 20   Temp: (!) 97.3 F (36.3 C)  98.1 F (36.7 C) 99 F (37.2 C)  TempSrc: Oral  Oral Axillary  SpO2:   100% 99%  Weight:      Height:        Current Vital Signs 24h Vital Sign Ranges  T 99 F (37.2 C) Temp  Avg: 98.1 F (36.7 C)  Min: 97.3 F (36.3 C)  Max: 99 F (37.2 C)  BP 125/82 BP  Min: 103/75  Max:  141/87  HR 99 Pulse  Avg: 84.9  Min: 59  Max: 115  RR 20 Resp  Avg: 17.8  Min: 13  Max: 22  SaO2 99 %   SpO2  Avg: 98.8 %  Min: 95 %  Max: 100 %       24 Hour I/O Current Shift I/O  Time Ins Outs 09/06 0701 - 09/07 0700 In: 881.5 [I.V.:781.5] Out: 2100 [Urine:1800] 09/07 0701 - 09/07 1900 In: -  Out: 275 [Urine:275]  General: NAD Pulmonary: no increased work of breathing Abdomen: non-distended, non-tender, fundus firm at level of umbilicus Inc: Clean/dry/intact Extremities: no edema, no erythema, no tenderness  Labs:  Recent Labs  Lab 02/03/18 0951  WBC 11.6*  HGB 12.0  HCT 35.7  PLT 198     Assessment:   22 y.o. G1P1001 postoperative day # 0, s/p primary cesarean section  Plan:  Continue current plan of care.    Disposition: Home POD#2-3  Conard Novak, MD, [facility] 02/04/2018 10:47 AM

## 2018-02-04 NOTE — Progress Notes (Signed)
  Labor Progress Note   22 y.o. G1P0000 @ [redacted]w[redacted]d , admitted for  Pregnancy, Labor Management. Post Dates Induction  Subjective:  Pt has slowly progressed to complete dilation w epidural in place and adequate ctxs on Pitocin w IUPC and FSE.  Objective:  BP (!) 113/59 (BP Location: Left Arm)   Pulse 78   Temp 97.7 F (36.5 C) (Oral)   Resp 18   Ht 5\' 3"  (1.6 m)   Wt 98 kg   LMP 04/25/2017   SpO2 99%   BMI 38.26 kg/m  Abd: gravid, ND Extr: trace to 1+ bilateral pedal edema SVE: CERVIX: 10 cm dilated  EFM: FHR: 150 bpm, variability: moderate,  accelerations:  Present,  Late decelerations:  Absent, occas early decels Toco: Frequency: Every 2 minutes Labs: I have reviewed the patient's lab results.   Assessment & Plan:  G1P0000 @ [redacted]w[redacted]d, admitted for  Pregnancy and Labor/Delivery Management  1. Pain management: epidural. 2. FWB: FHT category 1.  3. ID: GBS positive 4. Labor management: Protracted labor after pushing for 2 hours with minimal descent, despite adequate ctxs and pushing effort.  Concern for CPD.  Pt has some early decels at times w pushing, otherwise fetal wellbeing is reassuring.  Options dicussed for continued pushing vs cesarean. The risks of cesarean section discussed with the patient included but were not limited to: bleeding which may require transfusion or reoperation; infection which may require antibiotics; injury to bowel, bladder, ureters or other surrounding organs; injury to the fetus; need for additional procedures including hysterectomy in the event of a life-threatening hemorrhage; placental abnormalities wth subsequent pregnancies, incisional problems, thromboembolic phenomenon and other postoperative/anesthesia complications. The patient concurred with the proposed plan, giving informed written consent for the procedure.   All discussed with patient, see orders  Annamarie Major, MD, [facility], [facility] 02/04/2018  2:16  AM

## 2018-02-04 NOTE — Anesthesia Post-op Follow-up Note (Signed)
Anesthesia QCDR form completed.        

## 2018-02-04 NOTE — Lactation Note (Addendum)
This note was copied from a baby's chart. Lactation Consultation Note  Patient Name: Grace Griffin [id number] Date: 02/04/2018 Reason for consult: Initial assessment;Primapara;Term;Other (Comment)(Blood sugar issues)  Newborn has gone to SCN d/t low blood sugars and temperature stabilization issues.  Demonstrated hand expression of colostrum.  Set up Symphony DEBP with instructions in use, collection, storage, labeling, cleaning and handling of expressed colostrum.  Expressed 2 to 3 ml colostrum which was taken to SCN for baby.  Lactation name and number written on white board and encouraged to call with any questions, concerns or assistance. Maternal Data Formula Feeding for Exclusion: No Has patient been taught Hand Expression?: Yes Does the patient have breastfeeding experience prior to this delivery?: No  Feeding Feeding Type: Bottle Fed - Formula Nipple Type: Slow - flow Length of feed: 15 min  LATCH Score                   Interventions Interventions: Breast feeding basics reviewed;Coconut oil;Breast massage;Hand express;Support pillows;DEBP;Expressed milk  Lactation Tools Discussed/Used Tools: Pump;Coconut oil Breast pump type: Double-Electric Breast Pump WIC Program: Yes Pump Review: Setup, frequency, and cleaning;Milk Storage;Other (comment) Initiated by:: S.Barth Trella,RN,BSN,IBCLC Date initiated:: 02/04/18   Consult Status Consult Status: Follow-up Date: 02/04/18 Follow-up type: In-patient    Louis Meckel 02/04/2018, 5:52 PM

## 2018-02-04 NOTE — Transfer of Care (Signed)
Immediate Anesthesia Transfer of Care Note  Patient: Grace Griffin  Procedure(s) Performed: CESAREAN SECTION (N/A )  Patient Location: Mother/Baby  Anesthesia Type:Epidural  Level of Consciousness: awake, alert  and oriented  Airway & Oxygen Therapy: Patient Spontanous Breathing  Post-op Assessment: Report given to RN and Post -op Vital signs reviewed and stable  Post vital signs: Reviewed and stable  Last Vitals:  Vitals Value Taken Time  BP    Temp    Pulse    Resp    SpO2      Last Pain:  Vitals:   02/04/18 0251  TempSrc: Oral  PainSc:          Complications: No apparent anesthesia complications

## 2018-02-04 NOTE — Plan of Care (Signed)
  Problem: Education: Goal: Knowledge of General Education information will improve Description Including pain rating scale, medication(s)/side effects and non-pharmacologic comfort measures Outcome: Progressing   Problem: Education: Goal: Knowledge of Childbirth will improve Outcome: Progressing Goal: Ability to make informed decisions regarding treatment and plan of care will improve Outcome: Progressing Goal: Ability to state and carry out methods to decrease the pain will improve Outcome: Progressing   Problem: Coping: Goal: Ability to verbalize concerns and feelings about labor and delivery will improve Outcome: Progressing   Problem: Life Cycle: Goal: Ability to make normal progression through stages of labor will improve Outcome: Progressing Goal: Ability to effectively push during vaginal delivery will improve Outcome: Progressing   Problem: Role Relationship: Goal: Ability to demonstrate positive interaction with the child will improve Outcome: Progressing   Problem: Safety: Goal: Risk of complications during labor and delivery will decrease Outcome: Progressing   Problem: Pain Management: Goal: Relief or control of pain from uterine contractions will improve Outcome: Progressing   Problem: Education: Goal: Knowledge of condition will improve Outcome: Progressing   Problem: Activity: Goal: Will verbalize the importance of balancing activity with adequate rest periods Outcome: Progressing Goal: Ability to tolerate increased activity will improve Outcome: Progressing   Problem: Coping: Goal: Ability to identify and utilize available resources and services will improve Outcome: Progressing   Problem: Life Cycle: Goal: Chance of risk for complications during the postpartum period will decrease Outcome: Progressing   Problem: Role Relationship: Goal: Ability to demonstrate positive interaction with newborn will improve Outcome: Progressing   Problem: Skin  Integrity: Goal: Demonstration of wound healing without infection will improve Outcome: Progressing   

## 2018-02-04 NOTE — Lactation Note (Signed)
This note was copied from a baby's chart. Lactation Consultation Note  Patient Name: Boy [place] [id number] Date: 02/04/2018   Assisted mom with pumping again.  Expressed 5 to 6 ml which was taken to SCN.  Encouraged mom to hand express and/or pump every 2 to 4 hours or can visit in SCN and put baby back to the breast.  Maternal Data    Feeding Feeding Type: Bottle Fed - Formula(swabbed check with colostrum) Nipple Type: Slow - flow Length of feed: 20 min  LATCH Score                   Interventions    Lactation Tools Discussed/Used     Consult Status      Louis Meckel 02/04/2018, 7:51 PM

## 2018-02-04 NOTE — Discharge Summary (Signed)
OB Discharge Summary     Patient Name: Grace Griffin DOB: 11-27-1995 MRN: [id number]  Date of admission: 02/03/2018 Delivering MD: Letitia Libra, MD  Date of Delivery: 02/04/2018  Date of discharge: 02/07/2018  Admitting diagnosis: induction 40 wks preg Intrauterine pregnancy: [redacted]w[redacted]d     Secondary diagnosis: Post Dates, CPD with Arrest of Descent     Discharge diagnosis: Term Pregnancy Delivered, Failed induction of labor and Reasons for cesarean section  Arrest of Descent and CPD                         Hospital course:  Induction of Labor With Cesarean Section  22 y.o. yo G1P0000 at [redacted]w[redacted]d was admitted to the hospital 02/03/2018 for induction of labor. Patient had a labor course significant for Pitocin, AROM, Epidural, Second stage arrest. The patient went for cesarean section due to Arrest of Descent, and delivered a Viable infant,02/04/2018  Membrane Rupture Time/Date: 4:40 PM ,02/03/2018   Details of operation can be found in separate operative Note.  Patient had an uncomplicated postpartum course. She is ambulating, tolerating a regular diet, passing flatus, and urinating well.  Patient is discharged home in stable condition on 02/07/18.                                                                                                   Post partum procedures: none  Complications: None  Physical exam on 02/07/2018: Vitals:   02/06/18 1032 02/06/18 1609 02/06/18 2359 02/07/18 0740  BP: 120/77 123/82 133/77 112/68  Pulse: 85  84 79  Resp: 18 18 18 18   Temp: 98 F (36.7 C) 97.6 F (36.4 C) 98.6 F (37 C) 97.8 F (36.6 C)  TempSrc: Oral Oral Oral Oral  SpO2: 99% 98% 100% 99%  Weight:      Height:       General: alert, cooperative and no distress Lochia: appropriate Uterine Fundus: firm Incision: Healing well with no significant drainage, On Q pump d/c'd before discharge DVT Evaluation: No evidence of DVT seen on physical exam.  Labs: Lab Results  Component Value Date   WBC  15.0 (H) 02/04/2018   HGB 10.1 (L) 02/04/2018   HCT 30.3 (L) 02/04/2018   MCV 77.4 (L) 02/04/2018   PLT 159 02/04/2018   CMP Latest Ref Rng & Units 08/04/2017  Glucose 65 - 99 mg/dL 88  BUN 6 - 20 mg/dL 6  Creatinine 4.09 - 8.11 mg/dL 9.14  Sodium 782 - 956 mmol/L 137  Potassium 3.5 - 5.1 mmol/L 3.7  Chloride 101 - 111 mmol/L 104  CO2 22 - 32 mmol/L 23  Calcium 8.9 - 10.3 mg/dL 9.3  Total Protein 6.5 - 8.1 g/dL 7.2  Total Bilirubin 0.3 - 1.2 mg/dL 0.6  Alkaline Phos 38 - 126 U/L 50  AST 15 - 41 U/L 22  ALT 14 - 54 U/L 16    Discharge instruction: per After Visit Summary.  Medications:  Allergies as of 02/07/2018      Reactions   Penicillins    Had a reaction  as a baby ? reaction      Medication List    TAKE these medications   oxyCODONE 5 MG immediate release tablet Commonly known as:  Oxy IR/ROXICODONE Take 1 tablet (5 mg total) by mouth every 6 (six) hours as needed for up to 5 days for moderate pain or severe pain (Pain scale 4-7).   prenatal multivitamin Tabs tablet Take 1 tablet by mouth daily at 12 noon.            Discharge Care Instructions  (From admission, onward)         Start     Ordered   02/07/18 0000  Discharge wound care:    Comments:  Keep incision dry, clean.   02/07/18 0915          Diet: routine diet  Activity: Advance as tolerated. Pelvic rest for 6 weeks.   Outpatient follow up: Follow-up Information    Nadara Mustard, MD. Go in 1 week(s).   Specialty:  Obstetrics and Gynecology Why:  Post Op Contact information: [address] [place] [address] [phone]             Postpartum contraception: Undecided and discuss progesterone only options at 1 week visit Rhogam Given postpartum: no Rubella vaccine given postpartum: no Varicella vaccine given postpartum: no TDaP given antepartum or postpartum: Yes  Newborn Data: Live born female  Birth Weight: 8 lb 11.3 oz (3950 g) APGAR: 7, 9  Newborn Delivery    Birth date/time:  02/04/2018 03:14:00 Delivery type:  C-Section, Low Transverse C-section categorization:  Primary      Baby Feeding: Breast  Disposition:NICU  SIGNED:  Tresea Mall, CNM 02/07/2018 9:15 AM

## 2018-02-04 NOTE — Anesthesia Postprocedure Evaluation (Signed)
Anesthesia Post Note  Patient: Grace Griffin  Procedure(Griffin) Performed: CESAREAN SECTION (N/A )  Patient location during evaluation: PACU Anesthesia Type: Spinal Level of consciousness: oriented and awake and alert Pain management: pain level controlled Vital Signs Assessment: post-procedure vital signs reviewed and stable Respiratory status: spontaneous breathing, respiratory function stable and patient connected to nasal cannula oxygen Cardiovascular status: blood pressure returned to baseline and stable Postop Assessment: no headache, no backache and no apparent nausea or vomiting Anesthetic complications: no     Last Vitals:  Vitals:   02/04/18 0849 02/04/18 0932  BP: 119/66 126/85  Pulse: 95 86  Resp: 14 18  Temp:  36.7 C  SpO2:  100%    Last Pain:  Vitals:   02/04/18 0932  TempSrc: Oral  PainSc:                  Grace Griffin

## 2018-02-04 NOTE — Op Note (Signed)
Cesarean Section Procedure Note Indications: cephalo-pelvic disproportion, failure to progress: arrest of descent and term intrauterine pregnancy  Pre-operative Diagnosis: Intrauterine pregnancy [redacted]w[redacted]d ;  cephalo-pelvic disproportion, failed induction, failure to progress: arrest of descent and term intrauterine pregnancy Post-operative Diagnosis: same, delivered. Procedure: Low Transverse Cesarean Section Surgeon: Annamarie Major, MD, FACOG Assistant(s): Allyn Kenner, trch Anesthesia: Epidural anesthesia Estimated Blood Loss:300  Complications: None; patient tolerated the procedure well. Disposition: PACU - hemodynamically stable. Condition: stable  Findings: A female infant in the cephalic presentation. Amniotic fluid - Meconium  Birth weight 8-11lbs.  Apgars of 7 and 9.  Intact placenta with a three-vessel cord. Grossly normal uterus, tubes and ovaries bilaterally. No intraabdominal adhesions were noted.  Procedure Details   The patient was taken to Operating Room, identified as the correct patient and the procedure verified as C-Section Delivery. A Time Out was held and the above information confirmed. After induction of anesthesia, the patient was draped and prepped in the usual sterile manner. A Pfannenstiel incision was made and carried down through the subcutaneous tissue to the fascia. Fascial incision was made and extended transversely with the Mayo scissors. The fascia was separated from the underlying rectus tissue superiorly and inferiorly. The peritoneum was identified and entered bluntly. Peritoneal incision was extended longitudinally. The utero-vesical peritoneal reflection was incised transversely and a bladder flap was created digitally.  A low transverse hysterotomy was made. The fetus was delivered atraumatically. The umbilical cord was clamped x2 and cut and the infant was handed to the awaiting pediatricians. The placenta was removed intact and appeared normal with a 3-vessel  cord.  The uterus was exteriorized and cleared of all clot and debris. The hysterotomy was closed with running sutures of 0 Vicryl suture. A second imbricating layer was placed with the same suture. Excellent hemostasis was observed. The uterus was returned to the abdomen. The pelvis was irrigated and again, excellent hemostasis was noted.  The On Q Pain pump System was then placed.  Trocars were placed through the abdominal wall into the subfascial space and these were used to thread the silver soaker cathaters into place.The rectus fascia was then reapproximated with running sutures of Maxon, with careful placement not to incorporate the cathaters. Subcutaneous tissues are then irrigated with saline and hemostasis assured.  Skin is then closed with 4-0 vicryl suture in a subcuticular fashion followed by skin adhesive. The cathaters are flushed each with 5 mL of Bupivicaine and stabilized into place with dressing. Instrument, sponge, and needle counts were correct prior to the abdominal closure and at the conclusion of the case.  The patient tolerated the procedure well and was transferred to the recovery room in stable condition.   Annamarie Major, MD, [facility], [facility] 02/04/2018  3:48 AM

## 2018-02-05 NOTE — Progress Notes (Signed)
Obstetric Postpartum/PostOperative Daily Progress Note Subjective:  22 y.o. G1P1001 post-operative day # 1 status post primary cesarean delivery.  She is ambulating, is tolerating po, is voiding spontaneously.  Her pain is well controlled on PO pain medications. Her lochia is less than menses.   Medications SCHEDULED MEDICATIONS  . ibuprofen  600 mg Oral QID  . ketorolac  30 mg Intravenous Q6H  . prenatal multivitamin  1 tablet Oral Q1200  . senna-docusate  2 tablet Oral Q24H  . simethicone  80 mg Oral TID PC  . simethicone  80 mg Oral Q24H  . Tdap  0.5 mL Intramuscular Once    MEDICATION INFUSIONS  . bupivacaine 0.25 % ON-Q pump DUAL CATH 400 mL    . lactated ringers    . naLOXone [facility]) adult infusion for PRURITIS      PRN MEDICATIONS  acetaminophen, coconut oil, witch hazel-glycerin **AND** dibucaine, diphenhydrAMINE, diphenhydrAMINE **OR** diphenhydrAMINE, fentaNYL (SUBLIMAZE) injection, menthol-cetylpyridinium, meperidine (DEMEROL) injection, morphine injection, nalbuphine **OR** nalbuphine, nalbuphine **OR** nalbuphine, naloxone **AND** sodium chloride flush, naLOXone (NARCAN) adult infusion for PRURITIS, ondansetron (ZOFRAN) IV, ondansetron (ZOFRAN) IV, oxyCODONE, oxyCODONE-acetaminophen, oxyCODONE-acetaminophen, simethicone, zolpidem    Objective:   Vitals:   02/04/18 1430 02/04/18 1800 02/04/18 2058 02/05/18 0409  BP: 118/70 130/82 117/67 139/76  Pulse: 93 (!) 116 (!) 103 (!) 108  Resp: 18 19 18 20   Temp: 98 F (36.7 C) 97.8 F (36.6 C) 98.1 F (36.7 C) 99.3 F (37.4 C)  TempSrc: Oral Oral Oral Oral  SpO2: 99% 99% 99% 98%  Weight:      Height:        Current Vital Signs 24h Vital Sign Ranges  T 99.3 F (37.4 C) Temp  Avg: 98.4 F (36.9 C)  Min: 97.8 F (36.6 C)  Max: 99.3 F (37.4 C)  BP 139/76 BP  Min: 117/67  Max: 139/76  HR (!) 108 Pulse  Avg: 100  Min: 86  Max: 116  RR 20 Resp  Avg: 18.1  Min: 14  Max: 20  SaO2 98 % Room Air SpO2  Avg: 99 %  Min: 98 %   Max: 100 %       24 Hour I/O Current Shift I/O  Time Ins Outs 09/07 0701 - 09/08 0700 In: -  Out: 975 [Urine:975] No intake/output data recorded.  UOP not completely measured due to unmeasured voids.  General: NAD Pulmonary: no increased work of breathing, CTAB CV: RRR Abdomen: non-distended, non-tender, fundus firm at level of umbilicus Inc: Clean/dry/intact, dressing in place Extremities: no edema, no erythema, no tenderness  Labs:  Recent Labs  Lab 02/03/18 0951 02/04/18 1055  WBC 11.6* 15.0*  HGB 12.0 10.1*  HCT 35.7 30.3*  PLT 198 159     Assessment:   22 y.o. G1P1001 postoperative day # 1 status post primary cesarean section  Plan:  1) Acute blood loss anemia - hemodynamically stable and asymptomatic - po ferrous sulfate  2) O POS / Rubella 5.78 (01/14 1130)/ Varicella Immune  3) TDAP status: received 12/02/2017  4) breast feeding /Contraception = Xulane long term  5) Disposition: home POD#2-3  Conard Novak, MD, FACOG 02/05/2018 8:11 AM

## 2018-02-06 MED ORDER — OXYCODONE HCL 5 MG PO TABS: 10.0000 mg | ORAL_TABLET | ORAL | Status: DC | PRN

## 2018-02-06 MED ORDER — OXYCODONE HCL 5 MG PO TABS: 5.0000 mg | ORAL_TABLET | ORAL | Status: DC | PRN

## 2018-02-06 NOTE — Progress Notes (Signed)
POD#2 pLTCS Subjective:  Having some increased incisional pain, did not tolerate Percocet well. Ambulating and voiding without difficulty. Tolerating a regular diet.  Objective:  Blood pressure 120/77, pulse 85, temperature 98 F (36.7 C), temperature source Oral, resp. rate 18, height 5\' 3"  (1.6 m), weight 98 kg, last menstrual period 04/25/2017, SpO2 99 %.  General: NAD Pulmonary: no increased work of breathing Abdomen: non-distended, non-tender, fundus firm at level of umbilicus Incision: Dressing C/D/I, On-Q in place and infusing Extremities: trace edema, no erythema, no tenderness  Results for orders placed or performed during the hospital encounter of 02/03/18 (from the past 72 hour(s))  CBC     Status: Abnormal   Collection Time: 02/04/18 10:55 AM  Result Value Ref Range   WBC 15.0 (H) 3.6 - 11.0 K/uL   RBC 3.92 3.80 - 5.20 MIL/uL   Hemoglobin 10.1 (L) 12.0 - 16.0 g/dL   HCT 03.1 (L) 28.1 - 18.8 %   MCV 77.4 (L) 80.0 - 100.0 fL   MCH 25.7 (L) 26.0 - 34.0 pg   MCHC 33.3 32.0 - 36.0 g/dL   RDW 67.7 (H) 37.3 - 66.8 %   Platelets 159 150 - 440 K/uL    Comment: Performed at [facility], [address], [place], [place] [address]     Assessment:   22 y.o. G1P1001 post-operative day #2 in good condition.  Plan:  1) Acute blood loss anemia - hemodynamically stable and asymptomatic.  2) Blood Type --/--/O POS (09/06 0761) / Rubella 5.78 (01/14 1130) / Varicella Immune  3) TDAP status: received on 12/02/2017  4) Breastfeeding  5) Contraception- plans Xulane  6) Disposition: Continue postpartum care. Separate order for oxycodone so she can take Tylenol and ibuprofen. Heating pad as needed. Anticipate discharge on POD#3.  ,Marcelyn Bruins, CNM 02/06/2018

## 2018-02-07 ENCOUNTER — Ambulatory Visit: Payer: Self-pay

## 2018-02-07 MED ORDER — OXYCODONE HCL 5 MG PO TABS: 5.0000 mg | ORAL_TABLET | Freq: Four times a day (QID) | ORAL | 0 refills | Status: AC | PRN

## 2018-02-07 NOTE — Discharge Instructions (Signed)
Breast Pumping Tips If you are breastfeeding, there may be times when you cannot feed your baby directly. Returning to work or going on a trip are common examples. Pumping allows you to store breast milk and feed it to your baby later. You may not get much milk when you first start to pump. Your breasts should start to make more after a few days. If you pump at the times you usually feed your baby, you may be able to keep making enough milk to feed your baby without also using formula. The more often you pump, the more milk you will produce. When should I pump?  You can begin to pump soon after delivery. However, some experts recommend waiting about 4 weeks before giving your infant a bottle to make sure breastfeeding is going well.  If you plan to return to work, begin pumping a few weeks before. This will help you develop techniques that work best for you. It also lets you build up a supply of breast milk.  When you are with your infant, feed on demand and pump after each feeding.  When you are away from your infant for several hours, pump for about 15 minutes every 2-3 hours. Pump both breasts at the same time if you can.  If your infant has a formula feeding, make sure to pump around the same time.  If you drink any alcohol, wait 2 hours before pumping. How do I prepare to pump? Your let-down reflexis the natural reaction to stimulation that makes your breast milk flow. It is easier to stimulate this reflex when you are relaxed. Find relaxation techniques that work for you. If you have difficulty with your let-down reflex, try these methods:  Smell one of your infant's blankets or an item of clothing.  Look at a picture or video of your infant.  Sit in a quiet, private space.  Massage the breast you plan to pump.  Place soothing warmth on the breast.  Play relaxing music.  What are some general breast pumping tips?  Wash your hands before you pump. You do not need to wash  your nipples or breasts.  There are three ways to pump. ? You can use your hand to massage and compress your breast. ? You can use a handheld manual pump. ? You can use an electric pump.  Make sure the suction cup (flange) on the breast pump is the right size. Place the flange directly over the nipple. If it is the wrong size or placed the wrong way, it may be painful and cause nipple damage.  If pumping is uncomfortable, apply a small amount of purified or modified lanolin to your nipple and areola.  If you are using an electric pump, adjust the speed and suction power to be more comfortable.  If pumping is painful or if you are not getting very much milk, you may need a different type of pump. A lactation consultant can help you determine what type of pump to use.  Keep a full water bottle near you at all times. Drinking lots of fluid helps you make more milk.  You can store your milk to use later. Pumped breast milk can be stored in a sealable, sterile container or plastic bag. Label all stored breast milk with the date you pumped it. ? Milk can stay out at room temperature for up to 8 hours. ? You can store your milk in the refrigerator for up to 8 days. ? You  can store your milk in the freezer for 3 months. Thaw frozen milk using warm water. Do not put it in the microwave.  Do not smoke. Smoking can lower your milk supply and harm your infant. If you need help quitting, ask your health care provider to recommend a program. When should I call my health care provider or a lactation consultant?  You are having trouble pumping.  You are concerned that you are not making enough milk.  You have nipple pain, soreness, or redness.  You want to use birth control. Birth control pills may lower your milk supply. Talk to your health care provider about your options. This information is not intended to replace advice given to you by your health care provider. Make sure you discuss any  questions you have with your health care provider. Document Released: 11/04/2009 Document Revised: 10/29/2015 Document Reviewed: 03/09/2013 Elsevier Interactive Patient Education  2017 [employer] Cesarean Delivery, Care After Refer to this sheet in the next few weeks. These instructions provide you with information about caring for yourself after your procedure. Your health care provider may also give you more specific instructions. Your treatment has been planned according to current medical practices, but problems sometimes occur. Call your health care provider if you have any problems or questions after your procedure. What can I expect after the procedure? After the procedure, it is common to have:  A small amount of blood or clear fluid coming from the incision.  Some redness, swelling, and pain in your incision area.  Some abdominal pain and soreness.  Vaginal bleeding (lochia).  Pelvic cramps.  Fatigue.  Follow these instructions at home: Incision care   Follow instructions from your health care provider about how to take care of your incision. Make sure you: ? Wash your hands with soap and water before you change your bandage (dressing). If soap and water are not available, use hand sanitizer. ? If you have a dressing, change it as told by your health care provider. ? Leave stitches (sutures), skin staples, skin glue, or adhesive strips in place. These skin closures may need to stay in place for 2 weeks or longer. If adhesive strip edges start to loosen and curl up, you may trim the loose edges. Do not remove adhesive strips completely unless your health care provider tells you to do that.  Check your incision area every day for signs of infection. Check for: ? More redness, swelling, or pain. ? More fluid or blood. ? Warmth. ? Pus or a bad smell.  When you cough or sneeze, hug a pillow. This helps with pain and decreases the chance of your incision opening up  (dehiscing). Do this until your incision heals. Medicines  Take over-the-counter and prescription medicines only as told by your health care provider.  If you were prescribed an antibiotic medicine, take it as told by your health care provider. Do not stop taking the antibiotic until it is finished. Driving  Do not drive or operate heavy machinery while taking prescription pain medicine. Lifestyle  Do not drink alcohol. This is especially important if you are breastfeeding or taking pain medicine.  Do not use tobacco products, including cigarettes, chewing tobacco, or e-cigarettes. If you need help quitting, ask your health care provider. Tobacco can delay wound healing. Eating and drinking  Drink at least 8 eight-ounce glasses of water every day unless told not to by your health care provider. If you breastfeed, you may need to drink more water  than this.  Eat high-fiber foods every day. These foods may help prevent or relieve constipation. High-fiber foods include: ? Whole grain cereals and breads. ? Brown rice. ? Beans. ? Fresh fruits and vegetables. Activity  Return to your normal activities as told by your health care provider. Ask your health care provider what activities are safe for you.  Rest as much as possible. Try to rest or take a nap while your baby is sleeping.  Do not lift anything that is heavier than your baby or 10 lb (4.5 kg) as told by your health care provider.  Ask your health care provider when you can engage in sexual activity. This may depend on your: ? Risk of infection. ? Healing rate. ? Comfort and desire to engage in sexual activity. Bathing  Do not take baths, swim, or use a hot tub until your health care provider approves. Ask your health care provider if you can take showers. You may only be allowed to take sponge baths until your incision heals. General instructions  Do not use tampons or douches until your health care provider  approves.  Wear: ? Loose, comfortable clothing. ? A supportive and well-fitting bra.  Watch for any blood clots that may pass from your vagina. These may look like clumps of dark red, brown, or black discharge.  Keep your perineum clean and dry as told by your health care provider.  Wipe from front to back when you use the toilet.  If possible, have someone help you care for your baby and help with household activities for a few days after you leave the hospital.  Keep all follow-up visits for you and your baby as told by your health care provider. This is important. Contact a health care provider if:  You have: ? Bad-smelling vaginal discharge. ? Difficulty urinating. ? Pain when urinating. ? A sudden increase or decrease in the frequency of your bowel movements. ? More redness, swelling, or pain around your incision. ? More fluid or blood coming from your incision. ? Pus or a bad smell coming from your incision. ? A fever. ? A rash. ? Little or no interest in activities you used to enjoy. ? Questions about caring for yourself or your baby. ? Nausea.  Your incision feels warm to the touch.  Your breasts turn red or become painful or hard.  You feel unusually sad or worried.  You vomit.  You pass large blood clots from your vagina. If you pass a blood clot, save it to show to your health care provider. Do not flush blood clots down the toilet without showing your health care provider.  You urinate more than usual.  You are dizzy or light-headed.  You have not breastfed and have not had a menstrual period for 12 weeks after delivery.  You stopped breastfeeding and have not had a menstrual period for 12 weeks after stopping breastfeeding. Get help right away if:  You have: ? Pain that does not go away or get better with medicine. ? Chest pain. ? Difficulty breathing. ? Blurred vision or spots in your vision. ? Thoughts about hurting yourself or your baby. ? New  pain in your abdomen or in one of your legs. ? A severe headache.  You faint.  You bleed from your vagina so much that you fill two sanitary pads in one hour. This information is not intended to replace advice given to you by your health care provider. Make sure you discuss any questions  you have with your health care provider. Document Released: 02/06/2002 Document Revised: 06/19/2016 Document Reviewed: 04/21/2015 Elsevier Interactive Patient Education  [employer].

## 2018-02-07 NOTE — Progress Notes (Signed)
Discharge instructions complete and prescriptions given. Patient verbalizes understanding of teaching. Patient discharged home via wheelchair at 1330.

## 2018-02-07 NOTE — Lactation Note (Signed)
This note was copied from a baby's chart. Lactation Consultation Note  Patient Name: Grace Griffin Today's Date: 02/07/2018    Before mom went home today, I reviewed the pump, cleaning supplies, storage of milk and transport of milk with her. She pumped 15 ml from one breast and 20 ml from the other, so her supply is increasing now. I gave her a bag of Snappies bottles. She has [employer] In Style at home. I reviewed BF booklet with her. She has LC and [facility] info.    Maternal Data    Feeding Feeding Type: Formula Length of feed: 30 min  LATCH Score                   Interventions    Lactation Tools Discussed/Used Tools: Pump;74F feeding tube / Syringe   Consult Status      Sunday Corn 02/07/2018, 2:35 PM

## 2018-02-07 NOTE — Lactation Note (Signed)
This note was copied from a baby's chart. Vital signs stable. Tolerating q3h PO feeds. PO feedings getting better taking between 20-35 mL.  PIV in place. Infusing D10W. Wean of IV fluids x2 today. Voiding and stooling adequately this shift. Parents in this shift participating in care.

## 2018-02-08 ENCOUNTER — Ambulatory Visit (INDEPENDENT_AMBULATORY_CARE_PROVIDER_SITE_OTHER): Payer: Medicaid Other | Admitting: Obstetrics & Gynecology

## 2018-02-08 ENCOUNTER — Encounter: Payer: Self-pay | Admitting: Obstetrics & Gynecology

## 2018-02-08 VITALS — BP 130/80 | Ht 63.0 in | Wt 201.0 lb

## 2018-02-08 DIAGNOSIS — Z9889 Other specified postprocedural states: Secondary | ICD-10-CM

## 2018-02-08 DIAGNOSIS — O34219 Maternal care for unspecified type scar from previous cesarean delivery: Secondary | ICD-10-CM

## 2018-02-08 LAB — SURGICAL PATHOLOGY

## 2018-02-08 NOTE — Progress Notes (Signed)
  Postoperative Follow-up Patient presents post op from CS for CPDand Arrest of Dilation after IOL, 1 week ago.  Subjective: Patient reports some improvement in her preop symptoms. Eating a regular diet without difficulty. Pain is controlled with current analgesics. Medications being used: ibuprofen (OTC) and narcotic analgesics including Percocet.  Activity: sedentary. Patient reports vaginal sx's of min lochia. Breast feeding.  Objective: BP 130/80   Ht 5\' 3"  (1.6 m)   Wt 201 lb (91.2 kg)   BMI 35.61 kg/m  Physical Exam  Constitutional: She is oriented to person, place, and time. She appears well-developed and well-nourished. No distress.  Cardiovascular: Normal rate.  Pulmonary/Chest: Effort normal.  Abdominal: Soft. She exhibits no distension. There is no tenderness.  Incision Healing Well   Musculoskeletal: Normal range of motion.  Neurological: She is alert and oriented to person, place, and time. No cranial nerve deficit.  Skin: Skin is warm and dry.  Psychiatric: She has a normal mood and affect.    Assessment: s/p :  cesarean section progressing well  Plan: Patient has done well after surgery with no apparent complications.  I have discussed the post-operative course to date, and the expected progress moving forward.  The patient understands what complications to be concerned about.  I will see the patient in routine follow up, or sooner if needed.    Activity plan: No heavy lifting. Pelvic rest PAP nv Birth Control I discussed multiple birth control options and methods with the patient.  The risks and benefits of each were reviewed.  The possible side effects including deep venous thrombosis, breast tenderness, fluid retention, mood changes and abnormal vaginal bleeding were discussed.  Combination as well as progesterone-only options, pros and cons counseled.  To consider and decide by PP visit.   Letitia Libra 02/08/2018, 4:33 PM

## 2018-02-10 ENCOUNTER — Emergency Department: Payer: Medicaid Other

## 2018-02-10 ENCOUNTER — Observation Stay
Admission: EM | Admit: 2018-02-10 | Discharge: 2018-02-12 | Disposition: A | Discharge status: Home / Self Care | Payer: Medicaid Other | Attending: Obstetrics and Gynecology | Admitting: Obstetrics and Gynecology

## 2018-02-10 ENCOUNTER — Telehealth: Payer: Self-pay

## 2018-02-10 ENCOUNTER — Other Ambulatory Visit: Payer: Self-pay

## 2018-02-10 ENCOUNTER — Encounter: Payer: Self-pay | Admitting: Emergency Medicine

## 2018-02-10 DIAGNOSIS — Z79899 Other long term (current) drug therapy: Secondary | ICD-10-CM | POA: Insufficient documentation

## 2018-02-10 DIAGNOSIS — R197 Diarrhea, unspecified: Secondary | ICD-10-CM | POA: Insufficient documentation

## 2018-02-10 DIAGNOSIS — Z880 Allergy status to penicillin: Secondary | ICD-10-CM | POA: Diagnosis not present

## 2018-02-10 DIAGNOSIS — E869 Volume depletion, unspecified: Secondary | ICD-10-CM | POA: Diagnosis present

## 2018-02-10 DIAGNOSIS — Z9889 Other specified postprocedural states: Secondary | ICD-10-CM | POA: Insufficient documentation

## 2018-02-10 DIAGNOSIS — E860 Dehydration: Secondary | ICD-10-CM | POA: Diagnosis not present

## 2018-02-10 DIAGNOSIS — D72829 Elevated white blood cell count, unspecified: Secondary | ICD-10-CM

## 2018-02-10 DIAGNOSIS — R112 Nausea with vomiting, unspecified: Secondary | ICD-10-CM | POA: Diagnosis present

## 2018-02-10 DIAGNOSIS — O909 Complication of the puerperium, unspecified: Principal | ICD-10-CM | POA: Insufficient documentation

## 2018-02-10 DIAGNOSIS — N1330 Unspecified hydronephrosis: Secondary | ICD-10-CM | POA: Insufficient documentation

## 2018-02-10 HISTORY — DX: Nausea with vomiting, unspecified: R11.2

## 2018-02-10 HISTORY — DX: Other specified postprocedural states: Z98.890

## 2018-02-10 LAB — COMPREHENSIVE METABOLIC PANEL
ALT: 44 U/L (ref 0–44)
AST: 51 U/L — AB (ref 15–41)
Albumin: 3.5 g/dL (ref 3.5–5.0)
Alkaline Phosphatase: 144 U/L — ABNORMAL HIGH (ref 38–126)
Anion gap: 8 (ref 5–15)
BUN: 12 mg/dL (ref 6–20)
CHLORIDE: 105 mmol/L (ref 98–111)
CO2: 23 mmol/L (ref 22–32)
CREATININE: 0.58 mg/dL (ref 0.44–1.00)
Calcium: 9.3 mg/dL (ref 8.9–10.3)
GFR calc Af Amer: >60 mL/min (ref >60)
Glucose, Bld: 96 mg/dL (ref 70–99)
Potassium: 4.1 mmol/L (ref 3.5–5.1)
SODIUM: 136 mmol/L (ref 135–145)
Total Bilirubin: 0.5 mg/dL (ref 0.3–1.2)
Total Protein: 7.5 g/dL (ref 6.5–8.1)

## 2018-02-10 LAB — CBC
HCT: 35.3 % (ref 35.0–47.0)
HEMOGLOBIN: 11.8 g/dL — AB (ref 12.0–16.0)
MCH: 25.8 pg — ABNORMAL LOW (ref 26.0–34.0)
MCHC: 33.4 g/dL (ref 32.0–36.0)
MCV: 77.2 fL — AB (ref 80.0–100.0)
Platelets: 421 10*3/uL (ref 150–440)
RBC: 4.58 MIL/uL (ref 3.80–5.20)
RDW: 16.9 % — ABNORMAL HIGH (ref 11.5–14.5)
WBC: 14.5 10*3/uL — AB (ref 3.6–11.0)

## 2018-02-10 LAB — LACTIC ACID, PLASMA: Lactic Acid, Venous: 0.8 mmol/L (ref 0.5–1.9)

## 2018-02-10 MED ORDER — ONDANSETRON HCL 4 MG/2ML IJ SOLN
INTRAMUSCULAR | Status: AC
  Filled 2018-02-10: qty 2

## 2018-02-10 MED ORDER — ONDANSETRON HCL 4 MG PO TABS
4.0000 mg | ORAL_TABLET | Freq: Four times a day (QID) | ORAL | Status: DC | PRN
  Administered 2018-02-12: 4 mg via ORAL
  Filled 2018-02-10: qty 1

## 2018-02-10 MED ORDER — KCL IN DEXTROSE-NACL 20-5-0.45 MEQ/L-%-% IV SOLN
INTRAVENOUS | Status: DC
  Administered 2018-02-11 – 2018-02-12 (×4): via INTRAVENOUS
  Filled 2018-02-10 (×9): qty 1000

## 2018-02-10 MED ORDER — ENOXAPARIN SODIUM 40 MG/0.4ML ~~LOC~~ SOLN
40.0000 mg | SUBCUTANEOUS | Status: DC
  Administered 2018-02-11 (×2): 40 mg via SUBCUTANEOUS
  Filled 2018-02-10 (×2): qty 0.4

## 2018-02-10 MED ORDER — ONDANSETRON HCL 4 MG/2ML IJ SOLN
4.0000 mg | INTRAMUSCULAR | Status: AC
  Administered 2018-02-10: 4 mg via INTRAVENOUS

## 2018-02-10 MED ORDER — SODIUM CHLORIDE 0.9 % IV BOLUS
1000.0000 mL | Freq: Once | INTRAVENOUS | Status: AC
  Administered 2018-02-10: 1000 mL via INTRAVENOUS

## 2018-02-10 MED ORDER — ONDANSETRON HCL 4 MG/2ML IJ SOLN
4.0000 mg | INTRAMUSCULAR | Status: AC
  Administered 2018-02-10: 4 mg via INTRAVENOUS
  Filled 2018-02-10: qty 2

## 2018-02-10 MED ORDER — ONDANSETRON HCL 4 MG/2ML IJ SOLN
4.0000 mg | Freq: Four times a day (QID) | INTRAMUSCULAR | Status: DC | PRN
  Administered 2018-02-11: 4 mg via INTRAVENOUS
  Filled 2018-02-10: qty 2

## 2018-02-10 MED ORDER — IOPAMIDOL (ISOVUE-300) INJECTION 61%
100.0000 mL | Freq: Once | INTRAVENOUS | Status: AC | PRN
  Administered 2018-02-10: 100 mL via INTRAVENOUS

## 2018-02-10 MED ORDER — FAMOTIDINE IN NACL 20-0.9 MG/50ML-% IV SOLN
20.0000 mg | Freq: Two times a day (BID) | INTRAVENOUS | Status: DC
  Administered 2018-02-11 – 2018-02-12 (×4): 20 mg via INTRAVENOUS
  Filled 2018-02-10 (×5): qty 50

## 2018-02-10 NOTE — ED Notes (Signed)
Pt went to bathroom but forgot to get urine. Pt re-reminded.

## 2018-02-10 NOTE — ED Provider Notes (Signed)
[facility] Emergency Department Provider Note  ____________________________________________   First MD Initiated Contact with Patient 02/10/18 1712     (approximate)  I have reviewed the triage vital signs and the nursing notes.   HISTORY  Chief Complaint Weakness and Emesis    HPI Grace Griffin is a 22 y.o. female who is 6 days postpartum from C-section by Dr. Kenton Kingfisher.  She presents for evaluation of persistent severe nausea which over the last 2 days is developed into persistent vomiting.  She has also had multiple episodes of diarrhea.  She is not able to eat or drink much of anything.  She feels generalized weakness.  She describes all of her symptoms as severe and says that they have been getting worse since coming home from the hospital rather than better.  She reports subjective fever where she felt like she was "burning up" but did not take her temperature.  No chills/rigors.  She has had some persistent vaginal bleeding which she describes as a little bit less than a normal period.  She denies chest pain or shortness of breath.  She has had some lower abdominal pain since the surgery but that does not seem to have gotten worse.   Past Medical History:  Diagnosis Date  . Medical history non-contributory   . No known health problems     Patient Active Problem List   Diagnosis Date Noted  . Postpartum care following cesarean delivery 02/07/2018  . Single liveborn, born in hospital, delivered by cesarean delivery 02/04/2018  . BMI 34.0-34.9,adult 06/07/2017    Past Surgical History:  Procedure Laterality Date  . CESAREAN SECTION N/A 02/04/2018   Procedure: CESAREAN SECTION;  Surgeon: Gae Dry, MD;  Location: [facility] ORS;  Service: Obstetrics;  Laterality: N/A;  . TONSILLECTOMY     AND ADENOIDECTOMY  . WISDOM TOOTH EXTRACTION      Prior to Admission medications   Medication Sig Start Date End Date Taking? Authorizing Provider    oxyCODONE (OXY IR/ROXICODONE) 5 MG immediate release tablet Take 1 tablet (5 mg total) by mouth every 6 (six) hours as needed for up to 5 days for moderate pain or severe pain (Pain scale 4-7). 02/07/18 02/12/18  Rod Can, CNM    Allergies Penicillins  Family History  Problem Relation Age of Onset  . Breast cancer Paternal Grandmother   . Diabetes Father     Social History Social History   Tobacco Use  . Smoking status: Never Smoker  . Smokeless tobacco: Never Used  Substance Use Topics  . Alcohol use: No  . Drug use: No    Review of Systems Constitutional: No fever/chills.  Generalized weakness as described above Eyes: No visual changes. ENT: No sore throat. Cardiovascular: Denies chest pain. Respiratory: Denies shortness of breath. Gastrointestinal: As described above Genitourinary: Negative for dysuria. Musculoskeletal: Negative for neck pain.  Negative for back pain. Integumentary: Negative for rash. Neurological: Negative for headaches, focal weakness or numbness.   ____________________________________________   PHYSICAL EXAM:  VITAL SIGNS: ED Triage Vitals  Enc Vitals Group     BP 02/10/18 1617 124/78     Pulse Rate 02/10/18 1617 (!) 110     Resp 02/10/18 1617 20     Temp 02/10/18 1617 98.1 F (36.7 C)     Temp Source 02/10/18 1617 Oral     SpO2 02/10/18 1617 99 %     Weight 02/10/18 1618 91.2 kg (201 lb)     Height 02/10/18  1618 1.6 m ('5\' 3"'$ )     Head Circumference --      Peak Flow --      Pain Score 02/10/18 1617 8     Pain Loc --      Pain Edu? --      Excl. in [place]? --     Constitutional: Alert and oriented.  Nontoxic but does appear weak and uncomfortable Eyes: Conjunctivae are normal.  Head: Atraumatic. Nose: No congestion/rhinnorhea. Mouth/Throat: Mucous membranes are moist. Neck: No stridor.  No meningeal signs.   Cardiovascular: Normal rate, regular rhythm. Good peripheral circulation. Grossly normal heart sounds. Respiratory:  Normal respiratory effort.  No retractions. Lungs CTAB. Gastrointestinal: Obese, soft with diffuse tenderness throughout abdomen but no rebound or guarding.  Well-appearing C-section scar. Genitourinary: Deferred Musculoskeletal: No lower extremity tenderness nor edema. No gross deformities of extremities. Neurologic:  Normal speech and language. No gross focal neurologic deficits are appreciated.  Skin:  Skin is pale, warm, dry and intact. No rash noted. Psychiatric: Mood and affect are normal. Speech and behavior are normal.  ____________________________________________   LABS (all labs ordered are listed, but only abnormal results are displayed)  Labs Reviewed  CBC - Abnormal; Notable for the following components:      Result Value   WBC 14.5 (*)    Hemoglobin 11.8 (*)    MCV 77.2 (*)    MCH 25.8 (*)    RDW 16.9 (*)    All other components within normal limits  COMPREHENSIVE METABOLIC PANEL - Abnormal; Notable for the following components:   AST 51 (*)    Alkaline Phosphatase 144 (*)    All other components within normal limits  LACTIC ACID, PLASMA  URINALYSIS, COMPLETE (UACMP) WITH MICROSCOPIC   ____________________________________________  EKG  None - EKG not ordered by ED physician ____________________________________________  RADIOLOGY   ED MD interpretation: Uterine hematoma, no clear evidence of postoperative abscess or infection  Official radiology report(s): Ct Abdomen Pelvis W Contrast  Result Date: 02/10/2018 CLINICAL DATA:  Nausea vomiting postop C-section EXAM: CT ABDOMEN AND PELVIS WITH CONTRAST TECHNIQUE: Multidetector CT imaging of the abdomen and pelvis was performed using the standard protocol following bolus administration of intravenous contrast. CONTRAST:  187m ISOVUE-300 IOPAMIDOL (ISOVUE-300) INJECTION 61% COMPARISON:  None. FINDINGS: Lower chest: Lung bases demonstrate no acute consolidation or effusion. The heart size is normal. Hepatobiliary:  Subcentimeter hypodensity posterior dome of the liver too small to further characterize. No calcified stone or biliary dilatation Pancreas: Unremarkable. No pancreatic ductal dilatation or surrounding inflammatory changes. Spleen: Normal in size without focal abnormality. Adrenals/Urinary Tract: Adrenal glands are normal. Mild right hydronephrosis and hydroureter, likely due to recent pregnancy. Bladder unremarkable Stomach/Bowel: The stomach is nonenlarged. No dilated small bowel. No colon wall thickening. Appendix not well seen but no right lower quadrant inflammatory process. Vascular/Lymphatic: Nonaneurysmal aorta.  No significant adenopathy Reproductive: Enlarged uterus consistent with recent postpartum status. Heterogenous complex collection within the lower uterine segment and cervix measuring approximately 5.1 x 2.7 by 5.1 cm. No adnexal mass. Soft tissue fullness in the right lower quadrant likely reflects prominent adnexal vessels. Other: No free air. Edema within the rectus sheath and mild haziness within the anterior abdominal and pelvic fat likely due to resolving postoperative change. Musculoskeletal: No acute or significant osseous findings. IMPRESSION: 1. Negative for bowel obstruction or bowel wall thickening. 2. Postpartum uterus with history of Caesarean section. Heterogeneous complex collection in the lower uterine segment and cervix measuring 5.1 cm, possibly representing a  hematoma. 3. Mild right hydronephrosis and hydroureter, likely related to recent pregnancy. Electronically Signed   By: Donavan Foil M.D.   On: 02/10/2018 20:33    ____________________________________________   PROCEDURES  Critical Care performed: No   Procedure(s) performed:   Procedures   ____________________________________________   INITIAL IMPRESSION / ASSESSMENT AND PLAN / ED COURSE  As part of my medical decision making, I reviewed the following data within the [place]  notes reviewed and incorporated, Labs reviewed , Old chart reviewed, Discussed with admitting physician , A consult was requested and obtained from this/these consultant(s) OB/GYN and Notes from prior ED visits    Differential diagnosis includes, but is not limited to, postoperative and intractable nausea/vomiting, postoperative infection or abscess, intrauterine infection, viral gastroenteritis.  The patient is nontoxic but does not appear at all comfortable and does appear severely nauseated.  A dose of Zofran 4 mg IV did not help significantly.  She is getting 1 L normal saline and I anticipate ordering a second liter as well.  Her vital signs are generally reassuring but she was tachycardic upon arrival.  Lab work is notable for leukocytosis of 14.5, normal lactic acid, and essentially normal conference of metabolic panel except for an AST of 51 and alk phos of 144 which likely is related to the recent vomiting.  I discussed the case by phone with Dr. Glennon Mac with [facility] and after talking with him we decided to obtain a CT scan with IV contrast to look for signs of infection.  She does not have any clear signs of infection although she does have what appears to be a hematoma in the uterus although theoretically could represent an abscess but that would be extraordinarily uncommon.  I discussed the results again with Dr. Glennon Mac by phone and he said that he would come to the ED to evaluate the patient in person and anticipates admitting her for observation, fluids, antiemetics, and to make sure she is not showing any signs of developing an infection.  I updated the patient and her family and they understand and agree with the plan.  Clinical Course as of Feb 10 2118  Fri Feb 10, 2018  1744 Lactic Acid, Venous: 0.8 [CF]    Clinical Course User Index [CF] Hinda Kehr, MD    ____________________________________________  FINAL CLINICAL IMPRESSION(S) / ED DIAGNOSES  Final diagnoses:    Intractable vomiting with nausea, unspecified vomiting type  Volume depletion  Leukocytosis, unspecified type     MEDICATIONS GIVEN DURING THIS VISIT:  Medications  sodium chloride 0.9 % bolus 1,000 mL (has no administration in time range)  ondansetron (ZOFRAN) injection 4 mg (4 mg Intravenous Given 02/10/18 1747)  sodium chloride 0.9 % bolus 1,000 mL (1,000 mLs Intravenous New Bag/Given 02/10/18 1748)  iopamidol (ISOVUE-300) 61 % injection 100 mL (100 mLs Intravenous Contrast Given 02/10/18 1952)  ondansetron (ZOFRAN) injection 4 mg (4 mg Intravenous Given 02/10/18 2106)     ED Discharge Orders    None       Note:  This document was prepared using Dragon voice recognition software and may include unintentional dictation errors.    Hinda Kehr, MD 02/10/18 2119

## 2018-02-10 NOTE — Telephone Encounter (Signed)
Pt c/o delivered by c/s Saturday; ever since she hasn't had much of an appetite; says she threw up all night last night and is pretty sure she ran a fever last night; feels horrible. [phone]  Per J[facility], pt was adv to go to a [facility] clinic to see if she has the flu.  If not, may have a stomach bug.  Adv if not the flu, to sip clear liquids x24hrs, then bland food x24hrs, the work up to regular diet. States incision seems to be fine.

## 2018-02-10 NOTE — ED Triage Notes (Signed)
6 days post c section, began nausea, vomiting and weakness yesterday. Vag bleed less than a normal period.

## 2018-02-10 NOTE — H&P (Signed)
OBSTETRICS AND GYNECOLOGY ADMISSION HISTORY AND PHYSICAL NOTE    Attending Provider: Loleta Rose, MD  Grace P Vawter [id number] 02/10/2018 9:46 PM    Chief Complaint:   Grace Griffin is a 22 y.o. G1P1001 premenopausal female seen at the request of Dr. York Cerise in the Emergency Department for evaluation of intractable nausea and vomiting in the early postoperative period.    History of Present Ilness:   Grace is postop day #6 status post primary low transverse cesarean delivery for cephalo-pelvic disproportion, failure to progress, and arrest of descent.  The surgery was uncomplicated and she had an uncomplicated postoperative hospitalization.  She was evaluated in clinic two days ago as part of a routine post-operative incision check with no significant complaints noted.  She states that for the past two days she has had nausea and emesis and has not tolerated PO to any extent.  She state she has had subjective fevers, but has not taken them at home. Today she has had diarrhea, as well. Denies any sick contacts. She denies abdominal pain apart from expected incisional pain. She has improved lochia and denies heavy vaginal bleeding.  She states that she only voided twice yesterday and it requires a great deal of effort to get out of bed. She denies upper and lower respiratory symptoms. She denies chest pain and trouble breathing. Nothing seems to make the nausea and emesis better or worse.     Past Medical History:  Diagnosis Date  . No known health problems   . Post-operative nausea and vomiting 02/10/2018   Past Surgical History:  Procedure Laterality Date  . CESAREAN SECTION N/A 02/04/2018   Procedure: CESAREAN SECTION;  Surgeon: Nadara Mustard, MD;  Location: [facility] ORS;  Service: Obstetrics;  Laterality: N/A;  . TONSILLECTOMY     AND ADENOIDECTOMY  . WISDOM TOOTH EXTRACTION     Allergies  Allergen Reactions  . Penicillins     Has patient had a PCN reaction causing immediate  rash, facial/tongue/throat swelling, SOB or lightheadedness with hypotension: Unknown Has patient had a PCN reaction causing severe rash involving mucus membranes or skin necrosis: Unknown Has patient had a PCN reaction that required hospitalization: Unknown Has patient had a PCN reaction occurring within the last 10 years: Unknown If all of the above answers are "NO", then may proceed with Cephalosporin use.   Prior to Admission medications   Medication Sig Start Date End Date Taking? Authorizing Provider  oxyCODONE (OXY IR/ROXICODONE) 5 MG immediate release tablet Take 1 tablet (5 mg total) by mouth every 6 (six) hours as needed for up to 5 days for moderate pain or severe pain (Pain scale 4-7). 02/07/18 02/12/18  Tresea Mall, CNM    Obstetric History: She is a G34P1001 female s/p primary LTCS.    Social History:  She  reports that she has never smoked. She has never used smokeless tobacco. She reports that she does not drink alcohol or use drugs.  Family History:  family history includes Breast cancer in her paternal grandmother; Diabetes in her father.   Review of Systems:   Review of Systems  Constitutional: Positive for chills, fever and malaise/fatigue. Negative for diaphoresis and weight loss.  HENT: Negative.   Eyes: Negative.   Respiratory: Negative.   Cardiovascular: Negative.   Gastrointestinal: Positive for diarrhea, nausea and vomiting. Negative for abdominal pain, blood in stool, constipation, heartburn and melena.  Genitourinary: Negative.   Musculoskeletal: Negative.   Skin: Negative.   Neurological: Negative.  Psychiatric/Behavioral: Negative.      Objective    BP 135/82   Pulse 88   Temp 98.1 F (36.7 C) (Oral)   Resp 15   Ht 5\' 3"  (1.6 m)   Wt 91.2 kg   SpO2 98%   BMI 35.61 kg/m  Physical Exam  Physical Exam  Constitutional: She is oriented to person, place, and time. She appears well-developed and well-nourished. No distress.  HENT:  Head:  Normocephalic and atraumatic.  Eyes: Conjunctivae are normal. No scleral icterus.  Neck: Normal range of motion. Neck supple.  Cardiovascular: Normal rate and regular rhythm. Exam reveals no gallop and no friction rub.  No murmur heard. Pulmonary/Chest: Breath sounds normal. She is in respiratory distress. She has no wheezes. She has no rales.  Abdominal: Soft. She exhibits mass (uterus at U-2. Appropriately tender to palpation). She exhibits no distension. There is no tenderness. There is no rebound and no guarding.  Hypoactive bowel sounds  Musculoskeletal: Normal range of motion. She exhibits no edema.  Neurological: She is alert and oriented to person, place, and time. No cranial nerve deficit.  Skin: Skin is warm and dry. There is pallor.  Psychiatric: She has a normal mood and affect. Her behavior is normal. Judgment normal.    Laboratory Results:   Lab Results  Component Value Date   WBC 14.5 (H) 02/10/2018   RBC 4.58 02/10/2018   HGB 11.8 (L) 02/10/2018   HCT 35.3 02/10/2018   PLT 421 02/10/2018   NA 136 02/10/2018   K 4.1 02/10/2018   CREATININE 0.58 02/10/2018   Lab Results  Component Value Date   PREGTESTUR POSITIVE (A) 08/04/2017   Imaging Results: Ct Abdomen Pelvis W Contrast  Result Date: 02/10/2018 CLINICAL DATA:  Nausea vomiting postop C-section EXAM: CT ABDOMEN AND PELVIS WITH CONTRAST TECHNIQUE: Multidetector CT imaging of the abdomen and pelvis was performed using the standard protocol following bolus administration of intravenous contrast. CONTRAST:  100mL ISOVUE-300 IOPAMIDOL (ISOVUE-300) INJECTION 61% COMPARISON:  None. FINDINGS: Lower chest: Lung bases demonstrate no acute consolidation or effusion. The heart size is normal. Hepatobiliary: Subcentimeter hypodensity posterior dome of the liver too small to further characterize. No calcified stone or biliary dilatation Pancreas: Unremarkable. No pancreatic ductal dilatation or surrounding inflammatory changes.  Spleen: Normal in size without focal abnormality. Adrenals/Urinary Tract: Adrenal glands are normal. Mild right hydronephrosis and hydroureter, likely due to recent pregnancy. Bladder unremarkable Stomach/Bowel: The stomach is nonenlarged. No dilated small bowel. No colon wall thickening. Appendix not well seen but no right lower quadrant inflammatory process. Vascular/Lymphatic: Nonaneurysmal aorta.  No significant adenopathy Reproductive: Enlarged uterus consistent with recent postpartum status. Heterogenous complex collection within the lower uterine segment and cervix measuring approximately 5.1 x 2.7 by 5.1 cm. No adnexal mass. Soft tissue fullness in the right lower quadrant likely reflects prominent adnexal vessels. Other: No free air. Edema within the rectus sheath and mild haziness within the anterior abdominal and pelvic fat likely due to resolving postoperative change. Musculoskeletal: No acute or significant osseous findings. IMPRESSION: 1. Negative for bowel obstruction or bowel wall thickening. 2. Postpartum uterus with history of Caesarean section. Heterogeneous complex collection in the lower uterine segment and cervix measuring 5.1 cm, possibly representing a hematoma. 3. Mild right hydronephrosis and hydroureter, likely related to recent pregnancy. Electronically Signed   By: Jasmine PangKim  Fujinaga M.D.   On: 02/10/2018 20:33      Assessment & Plan   [place] P Griffin is a 22 y.o. G1P1001 premenopausal female  with postoperative nausea and vomiting.  In this early postoperative time period I am concerned for a surgery-related issue. She has had no sick contacts.  Her CT scan does not reveal of specific issue. A postoperative ileus is on the differential. She did not have oral contrast. So, a bowel obstruction (functional or mechanical) is also in the differential.  Gastroenteritis must be considered, as well.  There is a fluid collection in her lower uterine segment. It does not appear to be an abscess.  She does not have uterine tenderness.  However, will monitor for signs of endomyometritis.    Plan:  1.  Admit for observation 2.  IV fluid resuscitation 3.  NPO overnight. Slowly advance diet tomorrow. 4. SCDs and IV PPI for prophylaxis.   5. Zofran and phenergan for nausea.   6. Monitor for signs of worsening clinical condition.  Thomasene Mohair, MD 02/10/2018 9:46 PM

## 2018-02-11 LAB — URINALYSIS, COMPLETE (UACMP) WITH MICROSCOPIC
BILIRUBIN URINE: NEGATIVE
Bacteria, UA: NONE SEEN
GLUCOSE, UA: NEGATIVE mg/dL
Ketones, ur: 5 mg/dL — AB
LEUKOCYTES UA: NEGATIVE
NITRITE: NEGATIVE
PROTEIN: NEGATIVE mg/dL
Specific Gravity, Urine: 1.034 — ABNORMAL HIGH (ref 1.005–1.030)
pH: 6 (ref 5.0–8.0)

## 2018-02-11 LAB — INFLUENZA PANEL BY PCR (TYPE A & B)
INFLAPCR: NEGATIVE
Influenza B By PCR: NEGATIVE

## 2018-02-11 MED ORDER — METOCLOPRAMIDE HCL 10 MG PO TABS
10.0000 mg | ORAL_TABLET | Freq: Three times a day (TID) | ORAL | Status: DC
  Administered 2018-02-11 – 2018-02-12 (×4): 10 mg via ORAL
  Filled 2018-02-11 (×4): qty 1

## 2018-02-11 MED ORDER — METOCLOPRAMIDE HCL 10 MG PO TABS
10.0000 mg | ORAL_TABLET | Freq: Three times a day (TID) | ORAL | Status: DC
  Administered 2018-02-11: 10 mg via ORAL
  Filled 2018-02-11: qty 1

## 2018-02-11 NOTE — Progress Notes (Signed)
Patient ID: Grace Griffin P Mavis, female   DOB: 11/07/1995, 22 y.o.   MRN: [id number]   CHIEF COMPLAINT:   Chief Complaint  Patient presents with  . Weakness  . Emesis    Subjective  Grace Griffin reports that after her delivery Saturday she did not have a bowel movement until Wednesday. That bowel movement was not formed and was diarrhea. She has not had formed stools since her delivery. Yesterday (Friday) she also had diarrhea. She reports that yesterday (Friday) she vomitted three times. On Thursday she vomited twice. She has been self imposed NPO because of fear of vomiting. She last tried to eat Thursday . She had a small amount of vegetable soup, but ended up vomiting it up. She is feeling better today and has a small desire to eat but is hesitant.   She has been pumping while her infant is in the NICU, however she has not pumped in 24 hours because she has not been feeling well.     Objective   Examination:  General exam: Appears calm and comfortable  Respiratory system: Clear to auscultation. Respiratory effort normal. HEENT: [place]/AT, PERRLA, no thrush, no stridor. Cardiovascular system: S1 & S2 heard, RRR. No JVD, murmurs, rubs, gallops or clicks. No pedal edema. Gastrointestinal system: Abdomen is nondistended, soft and nontender. No organomegaly or masses felt. Minimal bowel sounds heard. Central nervous system: Alert and oriented. No focal neurological deficits. Extremities: Symmetric 5 x 5 power. Skin: No rashes, lesions or ulcers Psychiatry: Judgement and insight appear normal. Mood & affect appropriate.   VITALS:  height is 5\' 3"  (1.6 m) and weight is 91.2 kg. Her oral temperature is 98.1 F (36.7 C). Her blood pressure is 124/82 and her pulse is 71. Her respiration is 17 and oxygen saturation is 98%.   I personally reviewed Labs under Results section.  Radiology Reports Ct Abdomen Pelvis W Contrast  Result Date: 02/10/2018 CLINICAL DATA:  Nausea vomiting postop C-section EXAM:  CT ABDOMEN AND PELVIS WITH CONTRAST TECHNIQUE: Multidetector CT imaging of the abdomen and pelvis was performed using the standard protocol following bolus administration of intravenous contrast. CONTRAST:  100mL ISOVUE-300 IOPAMIDOL (ISOVUE-300) INJECTION 61% COMPARISON:  None. FINDINGS: Lower chest: Lung bases demonstrate no acute consolidation or effusion. The heart size is normal. Hepatobiliary: Subcentimeter hypodensity posterior dome of the liver too small to further characterize. No calcified stone or biliary dilatation Pancreas: Unremarkable. No pancreatic ductal dilatation or surrounding inflammatory changes. Spleen: Normal in size without focal abnormality. Adrenals/Urinary Tract: Adrenal glands are normal. Mild right hydronephrosis and hydroureter, likely due to recent pregnancy. Bladder unremarkable Stomach/Bowel: The stomach is nonenlarged. No dilated small bowel. No colon wall thickening. Appendix not well seen but no right lower quadrant inflammatory process. Vascular/Lymphatic: Nonaneurysmal aorta.  No significant adenopathy Reproductive: Enlarged uterus consistent with recent postpartum status. Heterogenous complex collection within the lower uterine segment and cervix measuring approximately 5.1 x 2.7 by 5.1 cm. No adnexal mass. Soft tissue fullness in the right lower quadrant likely reflects prominent adnexal vessels. Other: No free air. Edema within the rectus sheath and mild haziness within the anterior abdominal and pelvic fat likely due to resolving postoperative change. Musculoskeletal: No acute or significant osseous findings. IMPRESSION: 1. Negative for bowel obstruction or bowel wall thickening. 2. Postpartum uterus with history of Caesarean section. Heterogeneous complex collection in the lower uterine segment and cervix measuring 5.1 cm, possibly representing a hematoma. 3. Mild right hydronephrosis and hydroureter, likely related to recent pregnancy. Electronically Signed  By: Jasmine Pang M.D.   On: 02/10/2018 20:33       Assessment/Plan:  22 yo with likely Ileus 1. Reglan TID 2. Advance diet as tolerated, discussed this in detail with the patient. Will start with CLD and crackers.  3. Breast pump ordered and brought to bedside by nursing.  4. Encouraged ambulation to help return bowel function.  5. IVF hydration.   Code Status:  Full  Family Communication:  Not at bedside  Disposition Plan:  Home  DVT Prophylaxis  Ambulation Time spent:     LOS: 0 days   Aldea Avis R Jennalee Greaves

## 2018-02-11 NOTE — Lactation Note (Signed)
This note was copied from a baby's chart. Lactation Consultation Note  Patient Name: Grace SwazilandJordan Samaras MWUXL'KToday's Date: 02/11/2018  Mom readmitted for post operative nausea and vomitting.  Symphony double electiric breast pump set up in room and encouraged mom to continue to pump every 3 [facility].  Lactation name and number written on white board and encouraged to call with any questions, concerns or assistance.   Maternal Data    Feeding    LATCH Score                   Interventions    Lactation Tools Discussed/Used     Consult Status      Louis MeckelWilliams, Tahliyah Anagnos Kay 02/11/2018, 12:27 PM

## 2018-02-12 MED ORDER — ONDANSETRON HCL 4 MG PO TABS: 4.0000 mg | ORAL_TABLET | Freq: Four times a day (QID) | ORAL | 0 refills | Status: DC | PRN

## 2018-02-12 MED ORDER — METOCLOPRAMIDE HCL 10 MG PO TABS: 10.0000 mg | ORAL_TABLET | Freq: Every day | ORAL | 0 refills | Status: DC

## 2018-02-12 NOTE — Progress Notes (Signed)
Per the patient, she last vomited in the ED during admission. Last bowel movement was last night around 1900, stool more formed, but still loose. Desires eating soft/bland breakfast, reviewed options and discussed not overeating, etc. Encouraged ambulation in hallway before eating, and at least 3 times today. Continues to pump breast and visit infant in SCN. Reports feeling better overall.

## 2018-02-12 NOTE — Progress Notes (Signed)
Patient did wake and walk multiple (4+) rounds in hallway around nurse station and visited infant in SCN. Just finished eating a grilled cheese. Asking to take a shower. Once famotidine infusion is complete, will assist with getting a shower. Will continue to monitor.

## 2018-02-12 NOTE — Discharge Instructions (Signed)
Nausea, Adult Feeling sick to your stomach (nausea) means that your stomach is upset or you feel like you have to throw up (vomit). Feeling sick to your stomach is usually not serious, but it may be an early sign of a more serious medical problem. As you feel sicker to your stomach, it can lead to throwing up (vomiting). If you throw up, or if you are not able to drink enough fluids, there is a risk of dehydration. Dehydration can make you feel tired and thirsty, have a dry mouth, and pee (urinate) less often. Older adults and people who have other diseases or a weak defense (immune) system have a higher risk of dehydration. The main goal of treating this condition is to:  Limit how often you feel sick to your stomach.  Prevent throwing up and dehydration.  Follow these instructions at home: Follow instructions from your doctor about how to care for yourself at home. Eating and drinking Follow these recommendations as told by your doctor:  Take an oral rehydration solution (ORS). This is a drink that is sold at pharmacies and stores.  Drink clear fluids in small amounts as you are able, such as: ? Water. ? Ice chips. ? Fruit juice that has water added (diluted fruit juice). ? Low-calorie sports drinks.  Eat bland, easy to digest foods in small amounts as you are able, such as: ? Bananas. ? Applesauce. ? Rice. ? Lean meats. ? Toast. ? Crackers.  Avoid drinking fluids that contain a lot of sugar or caffeine.  Avoid alcohol.  Avoid spicy or fatty foods.  General instructions  Drink enough fluid to keep your pee (urine) clear or pale yellow.  Wash your hands often. If you cannot use soap and water, use hand sanitizer.  Make sure that all people in your household wash their hands well and often.  Rest at home while you get better.  Take over-the-counter and prescription medicines only as told by your doctor.  Breathe slowly and deeply when you feel sick to your  stomach.  Watch your condition for any changes.  Keep all follow-up visits as told by your doctor. This is important. Contact a doctor if:  You have a headache.  You have new symptoms.  You feel sicker to your stomach.  You have a fever.  You feel light-headed or dizzy.  You throw up.  You are not able to keep fluids down. Get help right away if:  You have pain in your chest, neck, arm, or jaw.  You feel very weak or you pass out (faint).  You have throw up that is bright red or looks like coffee grounds.  You have bloody or black poop (stools), or poop that looks like tar.  You have a very bad headache, a stiff neck, or both.  You have very bad pain, cramping, or bloating in your belly.  You have a rash.  You have trouble breathing or you are breathing very quickly.  Your heart is beating very quickly.  Your skin feels cold and clammy.  You feel confused.  You have pain while peeing.  You have signs of dehydration, such as: ? Dark pee, or very little or no pee. ? Cracked lips. ? Dry mouth. ? Sunken eyes. ? Sleepiness. ? Weakness. These symptoms may be an emergency. Do not wait to see if the symptoms will go away. Get medical help right away. Call your local emergency services (911 in the [place] Do not drive yourself to   the hospital. This information is not intended to replace advice given to you by your health care provider. Make sure you discuss any questions you have with your health care provider. Document Released: 05/06/2011 Document Revised: 10/23/2015 Document Reviewed: 01/21/2015 Elsevier Interactive Patient Education  2018 Elsevier Inc.  

## 2018-02-12 NOTE — Progress Notes (Signed)
Patient ID: Grace Griffin, female   DOB: 01/24/1996, 22 y.o.   MRN: [id number]   CHIEF COMPLAINT:   Chief Complaint  Patient presents with  . Weakness  . Emesis    Subjective  Grace is feeling better. She was able to tolerate a clear liquid diet. She reports some nausea yesterday, but no vomiting. She had a loose bowel movement yesterday. She is breast pumping. She ambulated in the hall.    Objective   Examination:  General exam: Appears calm and comfortable  Respiratory system: Clear to auscultation. Respiratory effort normal. HEENT: [place]/AT, PERRLA, no thrush, no stridor. Cardiovascular system: S1 & S2 heard, RRR. No JVD, murmurs, rubs, gallops or clicks. No pedal edema. Gastrointestinal system: Abdomen is nondistended, soft and nontender. No organomegaly or masses felt. Normal bowel sounds heard. Central nervous system: Alert and oriented. No focal neurological deficits. Extremities: Symmetric 5 x 5 power. Skin: No rashes, lesions or ulcers Psychiatry: Judgement and insight appear normal. Mood & affect appropriate.   VITALS:  height is 5\' 3"  (1.6 m) and weight is 91.2 kg. Her oral temperature is 98 F (36.7 C). Her blood pressure is 123/84 and her pulse is 72. Her respiration is 20 and oxygen saturation is 98%.   I personally reviewed Labs under Results section.  Radiology Reports Ct Abdomen Pelvis W Contrast  Result Date: 02/10/2018 CLINICAL DATA:  Nausea vomiting postop C-section EXAM: CT ABDOMEN AND PELVIS WITH CONTRAST TECHNIQUE: Multidetector CT imaging of the abdomen and pelvis was performed using the standard protocol following bolus administration of intravenous contrast. CONTRAST:  100mL ISOVUE-300 IOPAMIDOL (ISOVUE-300) INJECTION 61% COMPARISON:  None. FINDINGS: Lower chest: Lung bases demonstrate no acute consolidation or effusion. The heart size is normal. Hepatobiliary: Subcentimeter hypodensity posterior dome of the liver too small to further characterize. No  calcified stone or biliary dilatation Pancreas: Unremarkable. No pancreatic ductal dilatation or surrounding inflammatory changes. Spleen: Normal in size without focal abnormality. Adrenals/Urinary Tract: Adrenal glands are normal. Mild right hydronephrosis and hydroureter, likely due to recent pregnancy. Bladder unremarkable Stomach/Bowel: The stomach is nonenlarged. No dilated small bowel. No colon wall thickening. Appendix not well seen but no right lower quadrant inflammatory process. Vascular/Lymphatic: Nonaneurysmal aorta.  No significant adenopathy Reproductive: Enlarged uterus consistent with recent postpartum status. Heterogenous complex collection within the lower uterine segment and cervix measuring approximately 5.1 x 2.7 by 5.1 cm. No adnexal mass. Soft tissue fullness in the right lower quadrant likely reflects prominent adnexal vessels. Other: No free air. Edema within the rectus sheath and mild haziness within the anterior abdominal and pelvic fat likely due to resolving postoperative change. Musculoskeletal: No acute or significant osseous findings. IMPRESSION: 1. Negative for bowel obstruction or bowel wall thickening. 2. Postpartum uterus with history of Caesarean section. Heterogeneous complex collection in the lower uterine segment and cervix measuring 5.1 cm, possibly representing a hematoma. 3. Mild right hydronephrosis and hydroureter, likely related to recent pregnancy. Electronically Signed   By: Jasmine PangKim  Fujinaga M.D.   On: 02/10/2018 20:33      Assessment/Plan:  22 yo with Ileus vs. Gastroenteritis, improving. If she is able to tolerate a soft diet today she can be discharged home in the evening.   Code Status: Full  Family Communication: None  Disposition Plan: Home  DVT Prophylaxis  Lovenox - Heparin - SCDs None  Time spent: 15 minutes   LOS: 0 days   Alyse Kathan R Lesleigh Hughson

## 2018-02-12 NOTE — Progress Notes (Signed)
Patient has slept most of the morning, only got out of bed to use the bathroom. This nurse went into room to encourage patient to get up and get the day started and visitors were at bedside. Patient awake and alert, again voicing plan for the day to walk in hallway, eat soft/bland diet. Support and encouragement again provided. Will continue to monitor.

## 2018-02-12 NOTE — Discharge Summary (Addendum)
Physician Discharge Summary   Patient ID: Grace Griffin [id number] 22 y.o. 11/19/1995  Admit date: 02/10/2018  Discharge date and time: No discharge date for patient encounter.   Admitting Physician: Conard NovakStephen D Jackson, MD   Discharge Physician: Adelene Idlerhristanna Tell Rozelle MD  Admission Diagnoses: Post-operative nausea and vomiting [R11.2, Z98.890] Volume depletion [E86.9] Leukocytosis, unspecified type [D72.829] Intractable vomiting with nausea, unspecified vomiting type [R11.2]  Discharge Diagnoses: same as above  Admission Condition: fair  Discharged Condition: good  Indication for Admission: Dehydration and intractable nausea  Hospital Course: She was admitted and started on IVF and reglan. She slowly improved and was able to tolerate a soft and bland diet. She was the discharged home.  Consults: None  Significant Diagnostic Studies: None  Treatments: IV hydration  Discharge Exam: BP 121/69 (BP Location: Left Arm)   Pulse 82   Temp 98.4 F (36.9 C) (Oral)   Resp 18   Ht 5\' 3"  (1.6 m)   Wt 91.2 kg   SpO2 98%   BMI 35.61 kg/m   General Appearance:    Alert, cooperative, no distress, appears stated age  Head:    Normocephalic, without obvious abnormality, atraumatic  Eyes:    PERRL, conjunctiva/corneas clear, EOM's intact, fundi    benign, both eyes  Ears:    Normal TM's and external ear canals, both ears  Nose:   Nares normal, septum midline, mucosa normal, no drainage    or sinus tenderness  Throat:   Lips, mucosa, and tongue normal; teeth and gums normal  Neck:   Supple, symmetrical, trachea midline, no adenopathy;    thyroid:  no enlargement/tenderness/nodules; no carotid   bruit or JVD  Back:     Symmetric, no curvature, ROM normal, no CVA tenderness  Lungs:     Clear to auscultation bilaterally, respirations unlabored  Chest Wall:    No tenderness or deformity   Heart:    Regular rate and rhythm, S1 and S2 normal, no murmur, rub   or gallop  Breast Exam:     No tenderness, masses, or nipple abnormality  Abdomen:     Soft, non-tender, bowel sounds active all four quadrants,    no masses, no organomegaly  Genitalia:    Normal female without lesion, discharge or tenderness  Rectal:    Normal tone, normal prostate, no masses or tenderness;   guaiac negative stool  Extremities:   Extremities normal, atraumatic, no cyanosis or edema  Pulses:   2+ and symmetric all extremities  Skin:   Skin color, texture, turgor normal, no rashes or lesions  Lymph nodes:   Cervical, supraclavicular, and axillary nodes normal  Neurologic:   CNII-XII intact, normal strength, sensation and reflexes    throughout    Disposition: Discharge disposition: 01-Home or Self Care       Patient Instructions:  Allergies as of 02/12/2018      Reactions   Penicillins    Has patient had a PCN reaction causing immediate rash, facial/tongue/throat swelling, SOB or lightheadedness with hypotension: Unknown Has patient had a PCN reaction causing severe rash involving mucus membranes or skin necrosis: Unknown Has patient had a PCN reaction that required hospitalization: Unknown Has patient had a PCN reaction occurring within the last 10 years: Unknown If all of the above answers are "NO", then may proceed with Cephalosporin use.      Medication List    TAKE these medications   metoCLOPramide 10 MG tablet Commonly known as:  REGLAN Take 1 tablet (10  mg total) by mouth daily.   ondansetron 4 MG tablet Commonly known as:  ZOFRAN Take 1 tablet (4 mg total) by mouth every 6 (six) hours as needed for nausea.   oxyCODONE 5 MG immediate release tablet Commonly known as:  Oxy IR/ROXICODONE Take 1 tablet (5 mg total) by mouth every 6 (six) hours as needed for up to 5 days for moderate pain or severe pain (Pain scale 4-7).            Discharge Care Instructions  (From admission, onward)         Start     Ordered   02/12/18 0000  Discharge wound care:    Comments:   SHOWER DAILY Wash incision gently with soap and water.  Call office with any drainage, redness, or firmness of the incision.   02/12/18 1656         Activity: activity as tolerated Diet: bland diet Wound Care: keep wound clean and dry  Follow-up with [facility] in 1 week.  Signed: Natale Milch 02/12/2018 5:07 PM

## 2018-02-12 NOTE — Progress Notes (Signed)
Provided and reviewed AVS. Verified understanding by use of teach back method, pt verbalized understanding as well. Pt to make her own follow up appointment within one week. Will discharge with significant other to drive home.

## 2018-02-14 NOTE — Telephone Encounter (Signed)
Pt was in hosp over weekend.  She is still having the same problems - throwing up, can't eat, can't keep anything down, stomach feels like it's twisting and is in knots.  [phone]

## 2018-02-14 NOTE — Telephone Encounter (Signed)
Your patient 

## 2018-02-15 ENCOUNTER — Encounter: Payer: Self-pay | Admitting: Obstetrics & Gynecology

## 2018-02-15 ENCOUNTER — Ambulatory Visit (INDEPENDENT_AMBULATORY_CARE_PROVIDER_SITE_OTHER): Payer: Medicaid Other | Admitting: Obstetrics & Gynecology

## 2018-02-15 VITALS — BP 110/60 | Ht 63.0 in | Wt 190.0 lb

## 2018-02-15 DIAGNOSIS — R112 Nausea with vomiting, unspecified: Secondary | ICD-10-CM | POA: Diagnosis not present

## 2018-02-15 MED ORDER — METOCLOPRAMIDE HCL 10 MG PO TABS: 10.0000 mg | ORAL_TABLET | Freq: Three times a day (TID) | ORAL | 1 refills | Status: DC

## 2018-02-15 MED ORDER — PANTOPRAZOLE SODIUM 40 MG PO TBEC: 40.0000 mg | DELAYED_RELEASE_TABLET | Freq: Every day | ORAL | 1 refills | Status: DC

## 2018-02-15 NOTE — Progress Notes (Signed)
  Pt returns with nausea and vomiting as her chief complaint.  She was seen and evaluated in the hosital last weekend, normal CT and treated w IVF and meds.  Has been taking occas Reglan once a day and Pepcid OTC, and Zofran PRN, with limited improvement.  Also has had some recent diarrhea.  No sig abdominal pains.  CS 11 days ago, no scar or lochia or abdominal concerns.  Stopped breast feeding.   PMHx: She  has a past medical history of No known health problems and Post-operative nausea and vomiting (02/10/2018). Also,  has a past surgical history that includes Tonsillectomy; Wisdom tooth extraction; and Cesarean section (N/A, 02/04/2018)., family history includes Breast cancer in her paternal grandmother; Diabetes in her father.,  reports that she has never smoked. She has never used smokeless tobacco. She reports that she does not drink alcohol or use drugs.  She has a current medication list which includes the following prescription(s): metoclopramide, ondansetron, and pantoprazole. Also, is allergic to penicillins.  Review of Systems  Constitutional: Positive for malaise/fatigue. Negative for chills and fever.  HENT: Negative for congestion, sinus pain and sore throat.   Eyes: Negative for blurred vision and pain.  Respiratory: Negative for cough and wheezing.   Cardiovascular: Negative for chest pain and leg swelling.  Gastrointestinal: Positive for diarrhea, heartburn, nausea and vomiting. Negative for abdominal pain and constipation.  Genitourinary: Negative for dysuria, frequency, hematuria and urgency.  Musculoskeletal: Negative for back pain, joint pain, myalgias and neck pain.  Skin: Negative for itching and rash.  Neurological: Negative for dizziness, tremors and weakness.  Endo/Heme/Allergies: Does not bruise/bleed easily.  Psychiatric/Behavioral: Negative for depression. The patient is not nervous/anxious and does not have insomnia.    Objective: BP 110/60   Ht 5\' 3"  (1.6 m)   Wt  190 lb (86.2 kg)   BMI 33.66 kg/m  Physical Exam  Constitutional: She is oriented to person, place, and time. She appears well-developed and well-nourished. No distress.  Cardiovascular: Regular rhythm and normal heart sounds. Exam reveals no gallop.  No murmur heard. Pulmonary/Chest: Effort normal and breath sounds normal.  Abdominal: Soft. Normal appearance and bowel sounds are normal. There is no tenderness.  Inc healing well  Musculoskeletal: Normal range of motion.  Neurological: She is alert and oriented to person, place, and time.  Skin: Skin is warm and dry.  Psychiatric: She has a normal mood and affect.  Vitals reviewed.  ASSESSMENT/PLAN:    Nausea and vomiting, intractability of vomiting not specified, unspecified vomiting type    -  Primary                  No s/sx surgical complication                 Gastroenteritis, post op nausea are possibilities  Reglan TID, Protonix 40mg  daily, Zofran prn but try to decrease use Monitor diarrhea  Annamarie MajorPaul Yeilin Zweber, MD, Merlinda FrederickFACOG [facility], [facility] 02/15/2018  3:00 PM

## 2018-02-17 ENCOUNTER — Ambulatory Visit: Payer: Medicaid Other | Admitting: Obstetrics and Gynecology

## 2018-03-14 ENCOUNTER — Ambulatory Visit (INDEPENDENT_AMBULATORY_CARE_PROVIDER_SITE_OTHER): Payer: Medicaid Other | Admitting: Obstetrics & Gynecology

## 2018-03-14 ENCOUNTER — Other Ambulatory Visit (HOSPITAL_COMMUNITY)
Admission: RE | Admit: 2018-03-14 | Discharge: 2018-03-14 | Disposition: A | Discharge status: Home / Self Care | Payer: Medicaid Other | Source: Ambulatory Visit | Attending: Obstetrics & Gynecology | Admitting: Obstetrics & Gynecology

## 2018-03-14 ENCOUNTER — Encounter: Payer: Self-pay | Admitting: Obstetrics & Gynecology

## 2018-03-14 DIAGNOSIS — Z113 Encounter for screening for infections with a predominantly sexual mode of transmission: Secondary | ICD-10-CM | POA: Diagnosis not present

## 2018-03-14 DIAGNOSIS — Z124 Encounter for screening for malignant neoplasm of cervix: Secondary | ICD-10-CM | POA: Diagnosis present

## 2018-03-14 DIAGNOSIS — Z30430 Encounter for insertion of intrauterine contraceptive device: Secondary | ICD-10-CM | POA: Diagnosis not present

## 2018-03-14 NOTE — Progress Notes (Signed)
  OBSTETRICS POSTPARTUM CLINIC PROGRESS NOTE  Subjective:     Grace Griffin is a 21 y.o. G69P1001 female who presents for a postpartum visit. She is 6 weeks postpartum following a Term pregnancy and delivery by C-section failure to progress.  I have fully reviewed the prenatal and intrapartum course. Anesthesia: epidural.  Postpartum course has been complicated by uncomplicated.  Baby is feeding by Bottle.  Bleeding: patient has not  resumed menses.  Bowel function is normal. Bladder function is normal.  Patient is not sexually active. Contraception method desired is IUD.  Postpartum depression screening: negative. Edinburgh 0.  The following portions of the patient's history were reviewed and updated as appropriate: allergies, current medications, past family history, past medical history, past social history, past surgical history and problem list.  Review of Systems Pertinent items are noted in HPI.  Objective:    BP 120/80   Ht 5\' 3"  (1.6 m)   Wt 192 lb (87.1 kg)   BMI 34.01 kg/m   General:  alert and no distress   Breasts:  inspection negative, no nipple discharge or bleeding, no masses or nodularity palpable  Lungs: clear to auscultation bilaterally  Heart:  regular rate and rhythm, S1, S2 normal, no murmur, click, rub or gallop  Abdomen: soft, non-tender; bowel sounds normal; no masses,  no organomegaly.  Well healed Pfannenstiel incision   Vulva:  normal  Vagina: normal vagina, no discharge, exudate, lesion, or erythema  Cervix:  no cervical motion tenderness and no lesions  Corpus: normal size, contour, position, consistency, mobility, non-tender  Adnexa:  normal adnexa and no mass, fullness, tenderness  Rectal Exam: Not performed.        Assessment:  Post Partum Care visit following cesarean.  Plan:  See orders and Patient Instructions Follow up in: 12 months or as needed.  Contraception options discussed. PAP today   IUD PROCEDURE NOTE:  Grace Griffin is a 22 y.o. G1P1001 here for IUD insertion. No GYN concerns.  Last pap smear was today  IUD Insertion Procedure Note Patient identified, informed consent performed, consent signed.   Discussed risks of irregular bleeding, cramping, infection, malpositioning or misplacement of the IUD outside the uterus which may require further procedure such as laparoscopy, risk of failure <1%. Time out was performed.  Urine pregnancy test negative.  A bimanual exam showed the uterus to be midposition.  Speculum placed in the vagina.  Cervix visualized.  Cleaned with Betadine x 2.  Grasped anteriorly with a single tooth tenaculum.  Uterus sounded to 8 cm.   IUD placed per manufacturer's recommendations.  Strings trimmed to 3 cm. Tenaculum was removed, good hemostasis noted.  Patient tolerated procedure well.   Patient was given post-procedure instructions.  She was advised to have backup contraception for one week.  Patient was also asked to check IUD strings periodically and follow up in 4 weeks for IUD check.  Annamarie Major, MD, [facility], [facility] 03/14/2018  1:49 PM

## 2018-03-14 NOTE — Patient Instructions (Signed)

## 2018-03-16 LAB — CYTOLOGY - PAP
Chlamydia: NEGATIVE
Neisseria Gonorrhea: NEGATIVE

## 2018-03-17 ENCOUNTER — Telehealth: Payer: Self-pay | Admitting: Obstetrics & Gynecology

## 2018-03-17 NOTE — Telephone Encounter (Signed)
-----   Message from Nadara Mustard, MD sent at 03/17/2018  1:21 PM EDT ----- Sch colposcopy for low grade PAP

## 2018-03-17 NOTE — Progress Notes (Signed)
Sch colposcopy for low grade PAP

## 2018-03-17 NOTE — Telephone Encounter (Signed)
Unable to reach patient to leave mail for due to voicemail not set up.

## 2018-03-31 ENCOUNTER — Encounter: Payer: Self-pay | Admitting: Obstetrics & Gynecology

## 2018-03-31 ENCOUNTER — Ambulatory Visit (INDEPENDENT_AMBULATORY_CARE_PROVIDER_SITE_OTHER): Payer: Medicaid Other | Admitting: Obstetrics & Gynecology

## 2018-03-31 ENCOUNTER — Other Ambulatory Visit (HOSPITAL_COMMUNITY)
Admission: RE | Admit: 2018-03-31 | Discharge: 2018-03-31 | Disposition: A | Discharge status: Home / Self Care | Payer: Medicaid Other | Source: Ambulatory Visit | Attending: Obstetrics & Gynecology | Admitting: Obstetrics & Gynecology

## 2018-03-31 VITALS — BP 110/70 | Ht 63.0 in | Wt 187.0 lb

## 2018-03-31 DIAGNOSIS — R87612 Low grade squamous intraepithelial lesion on cytologic smear of cervix (LGSIL): Secondary | ICD-10-CM | POA: Diagnosis not present

## 2018-03-31 DIAGNOSIS — Z30431 Encounter for routine checking of intrauterine contraceptive device: Secondary | ICD-10-CM | POA: Diagnosis not present

## 2018-03-31 NOTE — Progress Notes (Signed)
HPI:  Grace Griffin is a 21 y.o.  G1P1001  who presents today for evaluation and management of abnormal cervical cytology.  Also check on IUD, which has been tolerated well since placement.  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  OB History  Gravida Para Term Preterm AB Living  1 1 1  0 0 1  SAB TAB Ectopic Multiple Live Births  0 0 0 0 1    # Outcome Date GA Lbr Len/2nd Weight Sex Delivery Anes PTL Lv  1 Term 02/04/18 [redacted]w[redacted]d / 02:49 8 lb 11.3 oz (3.95 kg) M CS-LTranv EPI  LIV     Birth Comments: molding/caput/bruising to head; meconium stained skin; peeling    Past Medical History:  Diagnosis Date  . No known health problems   . Post-operative nausea and vomiting 02/10/2018    Past Surgical History:  Procedure Laterality Date  . CESAREAN SECTION N/A 02/04/2018   Procedure: CESAREAN SECTION;  Surgeon: Nadara Mustard, MD;  Location: [facility] ORS;  Service: Obstetrics;  Laterality: N/A;  . TONSILLECTOMY     AND ADENOIDECTOMY  . WISDOM TOOTH EXTRACTION      SOCIAL HISTORY: Social History   Substance and Sexual Activity  Alcohol Use No   Social History   Substance and Sexual Activity  Drug Use No     Family History  Problem Relation Age of Onset  . Breast cancer Paternal Grandmother   . Diabetes Father     ALLERGIES:  Penicillins  Current Outpatient Medications on File Prior to Visit  Medication Sig Dispense Refill  . metoCLOPramide (REGLAN) 10 MG tablet Take 1 tablet (10 mg total) by mouth 3 (three) times daily before meals. (Patient not taking: Reported on 03/14/2018) 90 tablet 1  . ondansetron (ZOFRAN) 4 MG tablet Take 1 tablet (4 mg total) by mouth every 6 (six) hours as needed for nausea. (Patient not taking: Reported on 02/15/2018) 20 tablet 0  . pantoprazole (PROTONIX) 40 MG tablet Take 1 tablet (40 mg total) by mouth daily. (Patient not taking: Reported on 03/14/2018) 30 tablet 1   No current  facility-administered medications on file prior to visit.     Physical Exam: -Vitals:  BP 110/70   Ht 5\' 3"  (1.6 m)   Wt 187 lb (84.8 kg)   BMI 33.13 kg/m  GEN: WD, WN, NAD.  A+ O x 3, good mood and affect. ABD:  NT, ND.  Soft, no masses.  No hernias noted.   Pelvic:   Vulva: Normal appearance.  No lesions.  Vagina: No lesions or abnormalities noted.  Support: Normal pelvic support.  Urethra No masses tenderness or scarring.  Meatus Normal size without lesions or prolapse.  Cervix: See below. IUD strings 1 cm  Anus: Normal exam.  No lesions.  Perineum: Normal exam.  No lesions.        Bimanual   Uterus: Normal size.  Non-tender.  Mobile.  AV.  Adnexae: No masses.  Non-tender to palpation.  Cul-de-sac: Negative for abnormality.   PROCEDURE: 1.  Urine Pregnancy Test:  not done 2.  Colposcopy performed with 4% acetic acid after verbal consent obtained                                        -Aceto-white Lesions Location(s): 6, 12 o'clock.              -  Biopsy performed at 6, 12 o'clock               -ECC indicated and performed: Yes.       -Biopsy sites made hemostatic with pressure, AgNO3, and/or Monsel's solution   -Satisfactory colposcopy: Yes.      -Evidence of Invasive cervical CA :  NO  ASSESSMENT:  Grace Griffin is a 22 y.o. G1P1001 here for  1. LGSIL on Pap smear of cervix   2.      IUD checkup  PLAN: 1.  I discussed the grading system of pap smears and HPV high risk viral types.  We will discuss and base management after colpo results return. 2. Follow up PAP 6 months, vs intervention if high grade dysplasia identified 3. Treatment of persistantly abnormal PAP smears and cervical dysplasia, even mild, is discussed w pt today in detail, as well as the pros and cons of Cryo and LEEP procedures. Will consider and discuss after results. 4. IUD in proper orientation based on string check today.  Monitor bleeding on it.     Annamarie Major, MD, [facility],  [facility] 03/31/2018  10:11 AM

## 2018-03-31 NOTE — Patient Instructions (Signed)

## 2018-04-04 NOTE — Progress Notes (Signed)
Appt for follow up w [facility] in May 2020

## 2018-04-13 ENCOUNTER — Ambulatory Visit: Payer: Medicaid Other | Admitting: Obstetrics & Gynecology

## 2018-05-28 ENCOUNTER — Inpatient Hospital Stay
Admission: EM | Admit: 2018-05-28 | Discharge: 2018-05-29 | DRG: 195 | Disposition: A | Discharge status: Home / Self Care | Payer: Medicaid Other | Attending: Specialist | Admitting: Specialist

## 2018-05-28 ENCOUNTER — Emergency Department: Payer: Medicaid Other

## 2018-05-28 ENCOUNTER — Other Ambulatory Visit: Payer: Self-pay

## 2018-05-28 DIAGNOSIS — J189 Pneumonia, unspecified organism: Secondary | ICD-10-CM | POA: Diagnosis present

## 2018-05-28 DIAGNOSIS — Z880 Allergy status to penicillin: Secondary | ICD-10-CM | POA: Diagnosis not present

## 2018-05-28 DIAGNOSIS — R000 Tachycardia, unspecified: Secondary | ICD-10-CM | POA: Diagnosis present

## 2018-05-28 DIAGNOSIS — Z833 Family history of diabetes mellitus: Secondary | ICD-10-CM

## 2018-05-28 DIAGNOSIS — Z9089 Acquired absence of other organs: Secondary | ICD-10-CM | POA: Diagnosis not present

## 2018-05-28 DIAGNOSIS — Z803 Family history of malignant neoplasm of breast: Secondary | ICD-10-CM

## 2018-05-28 LAB — CBC WITH DIFFERENTIAL/PLATELET
Abs Immature Granulocytes: 0.13 10*3/uL — ABNORMAL HIGH (ref 0.00–0.07)
Basophils Absolute: 0 10*3/uL (ref 0.0–0.1)
Basophils Relative: 0 %
Eosinophils Absolute: 0 10*3/uL (ref 0.0–0.5)
Eosinophils Relative: 0 %
HCT: 39.6 % (ref 36.0–46.0)
Hemoglobin: 12.6 g/dL (ref 12.0–15.0)
Immature Granulocytes: 1 %
Lymphocytes Relative: 8 %
Lymphs Abs: 1.2 10*3/uL (ref 0.7–4.0)
MCH: 24.3 pg — ABNORMAL LOW (ref 26.0–34.0)
MCHC: 31.8 g/dL (ref 30.0–36.0)
MCV: 76.4 fL — ABNORMAL LOW (ref 80.0–100.0)
Monocytes Absolute: 0.9 10*3/uL (ref 0.1–1.0)
Monocytes Relative: 6 %
NEUTROS PCT: 85 %
Neutro Abs: 12.6 10*3/uL — ABNORMAL HIGH (ref 1.7–7.7)
Platelets: 249 10*3/uL (ref 150–400)
RBC: 5.18 MIL/uL — ABNORMAL HIGH (ref 3.87–5.11)
RDW: 14.7 % (ref 11.5–15.5)
WBC: 14.8 10*3/uL — ABNORMAL HIGH (ref 4.0–10.5)
nRBC: 0 % (ref 0.0–0.2)

## 2018-05-28 LAB — COMPREHENSIVE METABOLIC PANEL
ALT: 22 U/L (ref 0–44)
AST: 17 U/L (ref 15–41)
Albumin: 4.6 g/dL (ref 3.5–5.0)
Alkaline Phosphatase: 71 U/L (ref 38–126)
Anion gap: 10 (ref 5–15)
BUN: 9 mg/dL (ref 6–20)
CO2: 24 mmol/L (ref 22–32)
Calcium: 9.1 mg/dL (ref 8.9–10.3)
Chloride: 105 mmol/L (ref 98–111)
Creatinine, Ser: 0.86 mg/dL (ref 0.44–1.00)
GFR calc Af Amer: >60 mL/min (ref >60)
GFR calc non Af Amer: >60 mL/min (ref >60)
Glucose, Bld: 120 mg/dL — ABNORMAL HIGH (ref 70–99)
Potassium: 3.7 mmol/L (ref 3.5–5.1)
Sodium: 139 mmol/L (ref 135–145)
Total Bilirubin: 1.3 mg/dL — ABNORMAL HIGH (ref 0.3–1.2)
Total Protein: 7.7 g/dL (ref 6.5–8.1)

## 2018-05-28 LAB — POCT PREGNANCY, URINE: Preg Test, Ur: NEGATIVE

## 2018-05-28 LAB — FIBRIN DERIVATIVES D-DIMER (ARMC ONLY): Fibrin derivatives D-dimer (ARMC): 304.63 ng/mL (FEU) (ref 0.00–499.00)

## 2018-05-28 LAB — INFLUENZA PANEL BY PCR (TYPE A & B)
Influenza A By PCR: NEGATIVE
Influenza B By PCR: NEGATIVE

## 2018-05-28 LAB — PROCALCITONIN: Procalcitonin: <0.1 ng/mL

## 2018-05-28 MED ORDER — SODIUM CHLORIDE 0.9 % IV SOLN
1.0000 g | Freq: Once | INTRAVENOUS | Status: AC
  Administered 2018-05-28: 1 g via INTRAVENOUS
  Filled 2018-05-28: qty 10

## 2018-05-28 MED ORDER — ACETAMINOPHEN 650 MG RE SUPP: 650.0000 mg | Freq: Four times a day (QID) | RECTAL | Status: DC | PRN

## 2018-05-28 MED ORDER — SODIUM CHLORIDE 0.9 % IV SOLN
500.0000 mg | INTRAVENOUS | Status: DC
  Filled 2018-05-28: qty 500

## 2018-05-28 MED ORDER — SODIUM CHLORIDE 0.9 % IV SOLN
1.0000 g | INTRAVENOUS | Status: DC
  Filled 2018-05-28: qty 10

## 2018-05-28 MED ORDER — ONDANSETRON HCL 4 MG/2ML IJ SOLN: 4.0000 mg | Freq: Four times a day (QID) | INTRAMUSCULAR | Status: DC | PRN

## 2018-05-28 MED ORDER — SODIUM CHLORIDE 0.9 % IV SOLN
500.0000 mg | Freq: Once | INTRAVENOUS | Status: AC
  Administered 2018-05-28: 500 mg via INTRAVENOUS
  Filled 2018-05-28: qty 500

## 2018-05-28 MED ORDER — KETOROLAC TROMETHAMINE 15 MG/ML IJ SOLN
15.0000 mg | Freq: Four times a day (QID) | INTRAMUSCULAR | Status: DC | PRN
  Filled 2018-05-28: qty 1

## 2018-05-28 MED ORDER — GUAIFENESIN ER 600 MG PO TB12
600.0000 mg | ORAL_TABLET | Freq: Two times a day (BID) | ORAL | Status: DC
  Administered 2018-05-28 – 2018-05-29 (×2): 600 mg via ORAL
  Filled 2018-05-28 (×2): qty 1

## 2018-05-28 MED ORDER — ENOXAPARIN SODIUM 40 MG/0.4ML ~~LOC~~ SOLN
40.0000 mg | SUBCUTANEOUS | Status: DC
  Administered 2018-05-28: 40 mg via SUBCUTANEOUS
  Filled 2018-05-28: qty 0.4

## 2018-05-28 MED ORDER — GUAIFENESIN-CODEINE 100-10 MG/5ML PO SOLN
10.0000 mL | ORAL | Status: DC | PRN
  Filled 2018-05-28: qty 10

## 2018-05-28 MED ORDER — ACETAMINOPHEN 325 MG PO TABS
650.0000 mg | ORAL_TABLET | Freq: Four times a day (QID) | ORAL | Status: DC | PRN
  Administered 2018-05-29 (×2): 650 mg via ORAL
  Filled 2018-05-28 (×2): qty 2

## 2018-05-28 MED ORDER — SODIUM CHLORIDE 0.9 % IV SOLN
INTRAVENOUS | Status: DC
  Administered 2018-05-28 – 2018-05-29 (×2): via INTRAVENOUS

## 2018-05-28 MED ORDER — ONDANSETRON HCL 4 MG PO TABS: 4.0000 mg | ORAL_TABLET | Freq: Four times a day (QID) | ORAL | Status: DC | PRN

## 2018-05-28 MED ORDER — IBUPROFEN 400 MG PO TABS
400.0000 mg | ORAL_TABLET | Freq: Four times a day (QID) | ORAL | Status: DC | PRN
  Administered 2018-05-28: 18:00:00 400 mg via ORAL
  Filled 2018-05-28: qty 1

## 2018-05-28 MED ORDER — SODIUM CHLORIDE 0.9 % IV SOLN
1000.0000 mL | Freq: Once | INTRAVENOUS | Status: AC
  Administered 2018-05-28: 1000 mL via INTRAVENOUS

## 2018-05-28 NOTE — ED Notes (Signed)
Patient advised she wanted somewhere to lay down because she felt like she was going to pass out. I advised her to sit and I would let the nurse know.

## 2018-05-28 NOTE — Progress Notes (Signed)
Pt admitted from ED with husband at bedside. Harsh, frequent nonproductive cough. Pt refused cough syrup due to taste. Ibuprofen given with decreased HA. IVF's infusing. Azithromycin IV dose completed. Currently resting with easy, quiet, even respirations. Respiratory panel obtained and sent to lab.

## 2018-05-28 NOTE — ED Notes (Signed)
Pt report called.

## 2018-05-28 NOTE — ED Triage Notes (Signed)
Pt states she was seen by her PCP last week and dx with bronchitis and rx an inhaler and tusselin pearls with no relief. Pt is in NAD.

## 2018-05-28 NOTE — H&P (Signed)
[employer] - [place] [facility]   PATIENT NAME: Grace Griffin    MR#:  [id number]  DATE OF BIRTH:  [birth year]  DATE OF ADMISSION:  05/28/2018  PRIMARY CARE PHYSICIAN: System, Pcp Not In   REQUESTING/REFERRING PHYSICIAN: Jene Every, MD  CHIEF COMPLAINT:   Chief Complaint  Patient presents with  . URI    HISTORY OF PRESENT ILLNESS: Grace Spiering  is a 22 y.o. female with  No medical problems who is presenting with a complaint of having cough and shortness of breath ongoing for the past 3 weeks.  Patient went to urgent care and was diagnosed with acute bronchitis and was given some cough medications.  Symptoms continue to persist and got worse.  Patient came to the emergency room was noted to have a heart rate of 140s.  Chest x-ray shows bilateral pneumonia Patient having fevers chills.   PAST MEDICAL HISTORY:   Past Medical History:  Diagnosis Date  . No known health problems   . Post-operative nausea and vomiting 02/10/2018    PAST SURGICAL HISTORY:  Past Surgical History:  Procedure Laterality Date  . CESAREAN SECTION N/A 02/04/2018   Procedure: CESAREAN SECTION;  Surgeon: Nadara Mustard, MD;  Location: [facility] ORS;  Service: Obstetrics;  Laterality: N/A;  . TONSILLECTOMY     AND ADENOIDECTOMY  . WISDOM TOOTH EXTRACTION      SOCIAL HISTORY:  Social History   Tobacco Use  . Smoking status: Never Smoker  . Smokeless tobacco: Never Used  Substance Use Topics  . Alcohol use: No    FAMILY HISTORY:  Family History  Problem Relation Age of Onset  . Breast cancer Paternal Grandmother   . Diabetes Father     DRUG ALLERGIES:  Allergies  Allergen Reactions  . Penicillins     Has patient had a PCN reaction causing immediate rash, facial/tongue/throat swelling, SOB or lightheadedness with hypotension: Unknown Has patient had a PCN reaction causing severe rash involving mucus membranes or skin necrosis: Unknown Has patient had a PCN reaction that  required hospitalization: Unknown Has patient had a PCN reaction occurring within the last 10 years: Unknown If all of the above answers are "NO", then may proceed with Cephalosporin use.    REVIEW OF SYSTEMS:   CONSTITUTIONAL: No fever, fatigue or weakness.  EYES: No blurred or double vision.  EARS, NOSE, AND THROAT: No tinnitus or ear pain.  RESPIRATORY: Positive cough, positive shortness of breath, positive wheezing or hemoptysis.  CARDIOVASCULAR: No chest pain, orthopnea, edema.  GASTROINTESTINAL: No nausea, vomiting, diarrhea or abdominal pain.  GENITOURINARY: No dysuria, hematuria.  ENDOCRINE: No polyuria, nocturia,  HEMATOLOGY: No anemia, easy bruising or bleeding SKIN: No rash or lesion. MUSCULOSKELETAL: No joint pain or arthritis.   NEUROLOGIC: No tingling, numbness, weakness.  PSYCHIATRY: No anxiety or depression.   MEDICATIONS AT HOME:  Prior to Admission medications   Medication Sig Start Date End Date Taking? Authorizing Provider  metoCLOPramide (REGLAN) 10 MG tablet Take 1 tablet (10 mg total) by mouth 3 (three) times daily before meals. Patient not taking: Reported on 03/14/2018 02/15/18   Nadara Mustard, MD  ondansetron (ZOFRAN) 4 MG tablet Take 1 tablet (4 mg total) by mouth every 6 (six) hours as needed for nausea. Patient not taking: Reported on 02/15/2018 02/12/18   Natale Milch, MD  pantoprazole (PROTONIX) 40 MG tablet Take 1 tablet (40 mg total) by mouth daily. Patient not taking: Reported on 03/14/2018 02/15/18   Nadara Mustard,  MD      PHYSICAL EXAMINATION:   VITAL SIGNS: Blood pressure 124/74, pulse (!) 140, temperature 99 F (37.2 C), temperature source Oral, resp. rate 16, height 5\' 3"  (1.6 m), weight 77.1 kg, SpO2 94 %, unknown if currently breastfeeding.  GENERAL:  22 y.o.-year-old patient lying in the bed with no acute distress.  EYES: Pupils equal, round, reactive to light and accommodation. No scleral icterus. Extraocular muscles intact.   HEENT: Head atraumatic, normocephalic. Oropharynx and nasopharynx clear.  NECK:  Supple, no jugular venous distention. No thyroid enlargement, no tenderness.  LUNGS: Rhonchus breath sounds bilaterally no sensory muscle usage CARDIOVASCULAR: S1, S2 tachycardic no murmurs, rubs, or gallops.  ABDOMEN: Soft, nontender, nondistended. Bowel sounds present. No organomegaly or mass.  EXTREMITIES: No pedal edema, cyanosis, or clubbing.  NEUROLOGIC: Cranial nerves II through XII are intact. Muscle strength 5/5 in all extremities. Sensation intact. Gait not checked.  PSYCHIATRIC: The patient is alert and oriented x 3.  SKIN: No obvious rash, lesion, or ulcer.   LABORATORY PANEL:   CBC Recent Labs  Lab 05/28/18 1412  WBC 14.8*  HGB 12.6  HCT 39.6  PLT 249  MCV 76.4*  MCH 24.3*  MCHC 31.8  RDW 14.7  LYMPHSABS 1.2  MONOABS 0.9  EOSABS 0.0  BASOSABS 0.0   ------------------------------------------------------------------------------------------------------------------  Chemistries  Recent Labs  Lab 05/28/18 1412  NA 139  K 3.7  CL 105  CO2 24  GLUCOSE 120*  BUN 9  CREATININE 0.86  CALCIUM 9.1  AST 17  ALT 22  ALKPHOS 71  BILITOT 1.3*   ------------------------------------------------------------------------------------------------------------------ estimated creatinine clearance is 101.8 mL/min (by C-G formula based on SCr of 0.86 mg/dL). ------------------------------------------------------------------------------------------------------------------ No results for input(s): TSH, T4TOTAL, T3FREE, THYROIDAB in the last 72 hours.  Invalid input(s): FREET3   Coagulation profile No results for input(s): INR, PROTIME in the last 168 hours. ------------------------------------------------------------------------------------------------------------------- No results for input(s): DDIMER in the last 72  hours. -------------------------------------------------------------------------------------------------------------------  Cardiac Enzymes No results for input(s): CKMB, TROPONINI, MYOGLOBIN in the last 168 hours.  Invalid input(s): CK ------------------------------------------------------------------------------------------------------------------ Invalid input(s): POCBNP  ---------------------------------------------------------------------------------------------------------------  Urinalysis    Component Value Date/Time   COLORURINE YELLOW (A) 02/10/2018 1644   APPEARANCEUR CLEAR (A) 02/10/2018 1644   LABSPEC 1.034 (H) 02/10/2018 1644   PHURINE 6.0 02/10/2018 1644   GLUCOSEU NEGATIVE 02/10/2018 1644   HGBUR LARGE (A) 02/10/2018 1644   BILIRUBINUR NEGATIVE 02/10/2018 1644   KETONESUR 5 (A) 02/10/2018 1644   PROTEINUR NEGATIVE 02/10/2018 1644   NITRITE NEGATIVE 02/10/2018 1644   LEUKOCYTESUR NEGATIVE 02/10/2018 1644     RADIOLOGY: Dg Chest 2 View  Result Date: 05/28/2018 CLINICAL DATA:  Cough, fever. EXAM: CHEST - 2 VIEW COMPARISON:  Radiographs of July 05, 2015. FINDINGS: The heart size and mediastinal contours are within normal limits. No pneumothorax or pleural effusion is noted. Mild bibasilar airspace opacities are noted concerning for pneumonia, right greater than left. The visualized skeletal structures are unremarkable. IMPRESSION: Mild bibasilar airspace opacities are noted concerning for pneumonia, right greater than left. Electronically Signed   By: Lupita RaiderJames  Green Jr, M.D.   On: 05/28/2018 15:15    EKG: Orders placed or performed during the hospital encounter of 05/28/18  . ED EKG  . ED EKG    IMPRESSION AND PLAN: Patient is 22 year old presenting with cough shortness of breath tachycardia  1.  Sepsis based on tachycardia leukocytosis this is related to pneumonia We will treat with IV Rocephin and azithromycin I will also check respiratory virus  panel Will obtain sputum culture Await influenza a and B test Use Xopenex nebs due to tachycardia  2.  Sinus tachycardia due to #1 we will monitor on telemetry patient's heart rate should improve with treatment of her underlying condition  3.  Miscellaneous Lovenox for DVT prophylaxis       All the records are reviewed and case discussed with ED provider. Management plans discussed with the patient, family and they are in agreement.  CODE STATUS: Code Status History    Date Active Date Inactive Code Status Order ID Comments User Context   02/10/2018 2202 02/12/2018 2109 Full Code [id number]  Conard NovakJackson, Stephen D, MD ED   02/03/2018 0839 02/04/2018 0400 Full Code [id number]  Farrel ConnersGutierrez, Colleen, CNM Inpatient   01/01/2018 1614 01/01/2018 2201 Full Code [id number]  Vena AustriaStaebler, Andreas, MD Inpatient   11/29/2017 2232 11/30/2017 0515 Full Code [id number]  Conard NovakJackson, Stephen D, MD Inpatient       TOTAL TIME TAKING CARE OF THIS PATIENT: 55minutes.    Auburn BilberryShreyang Rihan Schueler M.D on 05/28/2018 at 4:24 PM  Between 7am to 6pm - Pager - [phone]  After 6pm go to [email or web address] - password [email or web address] [facility]  [employer] [facility] Office  [phone]  CC: Primary care physician; System, Pcp Not In

## 2018-05-28 NOTE — ED Provider Notes (Signed)
[facility] Emergency Department Provider Note   ____________________________________________    I have reviewed the triage vital signs and the nursing notes.   HISTORY  Chief Complaint Cough    HPI Grace Griffin is a 22 y.o. female who presents with complaints of cough and mild shortness of breath.  Patient reports seen at urgent care last week, diagnosed with bronchitis, started on Tessalon Perles with little improvement.  Reports chills, fatigue, mild shortness of breath with exertion.  No recent travel.  No calf pain or swelling.  No history of blood clots.  She reports she has a 7055-month-old at home.  Has not been on antibiotics   Past Medical History:  Diagnosis Date  . No known health problems   . Post-operative nausea and vomiting 02/10/2018    Patient Active Problem List   Diagnosis Date Noted  . PNA (pneumonia) 05/28/2018  . LGSIL on Pap smear of cervix 03/31/2018  . Intractable nausea and vomiting 02/10/2018  . Postpartum care following cesarean delivery 02/07/2018  . Single liveborn, born in hospital, delivered by cesarean delivery 02/04/2018  . BMI 34.0-34.9,adult 06/07/2017    Past Surgical History:  Procedure Laterality Date  . CESAREAN SECTION N/A 02/04/2018   Procedure: CESAREAN SECTION;  Surgeon: Nadara MustardHarris, Sally Reimers P, MD;  Location: [facility] ORS;  Service: Obstetrics;  Laterality: N/A;  . TONSILLECTOMY     AND ADENOIDECTOMY  . WISDOM TOOTH EXTRACTION      Prior to Admission medications   Medication Sig Start Date End Date Taking? Authorizing Provider  metoCLOPramide (REGLAN) 10 MG tablet Take 1 tablet (10 mg total) by mouth 3 (three) times daily before meals. Patient not taking: Reported on 03/14/2018 02/15/18   Nadara MustardHarris, Scarlette Hogston P, MD  ondansetron (ZOFRAN) 4 MG tablet Take 1 tablet (4 mg total) by mouth every 6 (six) hours as needed for nausea. Patient not taking: Reported on 02/15/2018 02/12/18   Natale MilchSchuman, Christanna R, MD    pantoprazole (PROTONIX) 40 MG tablet Take 1 tablet (40 mg total) by mouth daily. Patient not taking: Reported on 03/14/2018 02/15/18   Nadara MustardHarris, Ambra Haverstick P, MD     Allergies Penicillins  Family History  Problem Relation Age of Onset  . Breast cancer Paternal Grandmother   . Diabetes Father     Social History Social History   Tobacco Use  . Smoking status: Never Smoker  . Smokeless tobacco: Never Used  Substance Use Topics  . Alcohol use: No  . Drug use: No    Review of Systems  Constitutional: As above Eyes: No visual changes.  ENT: No sore throat. Cardiovascular: Denies chest pain. Respiratory: As above Gastrointestinal: No abdominal pain.  No nausea, no vomiting.   Genitourinary: Negative for dysuria. Musculoskeletal: Negative for back pain. Skin: Negative for rash. Neurological: Negative for headaches or weakness   ____________________________________________   PHYSICAL EXAM:  VITAL SIGNS: ED Triage Vitals  Enc Vitals Group     BP 05/28/18 1354 124/74     Pulse Rate 05/28/18 1354 (!) 140     Resp 05/28/18 1354 16     Temp 05/28/18 1354 99 F (37.2 C)     Temp Source 05/28/18 1354 Oral     SpO2 05/28/18 1354 94 %     Weight 05/28/18 1354 77.1 kg (170 lb)     Height 05/28/18 1354 1.6 m (5\' 3" )     Head Circumference --      Peak Flow --  Pain Score 05/28/18 1357 8     Pain Loc --      Pain Edu? --      Excl. in GC? --     Constitutional: Alert and oriented. Eyes: Conjunctivae are normal.   Nose: No congestion/rhinnorhea. Mouth/Throat: Mucous membranes are moist.    Cardiovascular: Tachycardia, regular rhythm. Grossly normal heart sounds.  Good peripheral circulation. Respiratory: Normal respiratory effort.  No retractions.  No wheezing Gastrointestinal: Soft and nontender. No distention.   Musculoskeletal: No lower extremity tenderness nor edema.  Warm and well perfused Neurologic:  Normal speech and language. No gross focal neurologic  deficits are appreciated.  Skin:  Skin is warm, dry and intact. No rash noted. Psychiatric: Mood and affect are normal. Speech and behavior are normal.  ____________________________________________   LABS (all labs ordered are listed, but only abnormal results are displayed)  Labs Reviewed  CBC WITH DIFFERENTIAL/PLATELET - Abnormal; Notable for the following components:      Result Value   WBC 14.8 (*)    RBC 5.18 (*)    MCV 76.4 (*)    MCH 24.3 (*)    Neutro Abs 12.6 (*)    Abs Immature Granulocytes 0.13 (*)    All other components within normal limits  COMPREHENSIVE METABOLIC PANEL - Abnormal; Notable for the following components:   Glucose, Bld 120 (*)    Total Bilirubin 1.3 (*)    All other components within normal limits  CULTURE, BLOOD (ROUTINE X 2)  CULTURE, BLOOD (ROUTINE X 2)  FIBRIN DERIVATIVES D-DIMER ([facility] ONLY)  INFLUENZA PANEL BY PCR (TYPE A & B)  POCT PREGNANCY, URINE   ____________________________________________  EKG  ED ECG REPORT I, Jene Everyobert Addelynn Batte, the attending physician, personally viewed and interpreted this ECG.  Date: 05/28/2018  Rhythm: Sinus tachycardia QRS Axis: normal Intervals: normal ST/T Wave abnormalities: normal Narrative Interpretation: no evidence of acute ischemia  ____________________________________________  RADIOLOGY  Chest x-ray demonstrates multi lobar pneumonia ____________________________________________   PROCEDURES  Procedure(s) performed: No  Procedures   Critical Care performed: No ____________________________________________   INITIAL IMPRESSION / ASSESSMENT AND PLAN / ED COURSE  Pertinent labs & imaging results that were available during my care of the patient were reviewed by me and considered in my medical decision making (see chart for details).  Patient presents with severe cough, elevated heart rate, her white blood cell count is elevated.  Chest x-ray is concerning for pneumonia.  Given that it  is a multifocal pneumonia with significant tachycardia and elevated white blood cell count we will give IV azithromycin, IV Rocephin and admit to the hospital    ____________________________________________   FINAL CLINICAL IMPRESSION(S) / ED DIAGNOSES  Final diagnoses:  Community acquired pneumonia, unspecified laterality        Note:  This document was prepared using Dragon voice recognition software and may include unintentional dictation errors.    Jene EveryKinner, Riot Waterworth, MD 05/28/18 [phone]

## 2018-05-29 LAB — BASIC METABOLIC PANEL
ANION GAP: 7 (ref 5–15)
BUN: 9 mg/dL (ref 6–20)
CO2: 23 mmol/L (ref 22–32)
Calcium: 8.2 mg/dL — ABNORMAL LOW (ref 8.9–10.3)
Chloride: 112 mmol/L — ABNORMAL HIGH (ref 98–111)
Creatinine, Ser: 0.72 mg/dL (ref 0.44–1.00)
GFR calc non Af Amer: >60 mL/min (ref >60)
GLUCOSE: 101 mg/dL — AB (ref 70–99)
Potassium: 3.8 mmol/L (ref 3.5–5.1)
Sodium: 142 mmol/L (ref 135–145)

## 2018-05-29 LAB — RESPIRATORY PANEL BY PCR
Adenovirus: NOT DETECTED
Bordetella pertussis: NOT DETECTED
CORONAVIRUS NL63-RVPPCR: NOT DETECTED
Chlamydophila pneumoniae: NOT DETECTED
Coronavirus 229E: NOT DETECTED
Coronavirus HKU1: NOT DETECTED
Coronavirus OC43: NOT DETECTED
Influenza A: NOT DETECTED
Influenza B: NOT DETECTED
Metapneumovirus: NOT DETECTED
Mycoplasma pneumoniae: NOT DETECTED
Parainfluenza Virus 1: NOT DETECTED
Parainfluenza Virus 2: NOT DETECTED
Parainfluenza Virus 3: NOT DETECTED
Parainfluenza Virus 4: NOT DETECTED
Respiratory Syncytial Virus: NOT DETECTED
Rhinovirus / Enterovirus: NOT DETECTED

## 2018-05-29 LAB — CBC
HEMATOCRIT: 36.3 % (ref 36.0–46.0)
Hemoglobin: 11.6 g/dL — ABNORMAL LOW (ref 12.0–15.0)
MCH: 25.4 pg — ABNORMAL LOW (ref 26.0–34.0)
MCHC: 32 g/dL (ref 30.0–36.0)
MCV: 79.6 fL — AB (ref 80.0–100.0)
NRBC: 0 % (ref 0.0–0.2)
Platelets: 216 10*3/uL (ref 150–400)
RBC: 4.56 MIL/uL (ref 3.87–5.11)
RDW: 14.8 % (ref 11.5–15.5)
WBC: 12.8 10*3/uL — AB (ref 4.0–10.5)

## 2018-05-29 MED ORDER — LEVOFLOXACIN 750 MG PO TABS: 750.0000 mg | ORAL_TABLET | Freq: Every day | ORAL | 0 refills | Status: AC

## 2018-05-29 NOTE — Discharge Summary (Signed)
[facility] - [place] at [facility]   PATIENT NAME: Grace Griffin    MR#:  [id number]  DATE OF BIRTH:  Oct 03, 1995  DATE OF ADMISSION:  05/28/2018 ADMITTING PHYSICIAN: Auburn Bilberry, MD  DATE OF DISCHARGE: 05/29/2018  1:04 PM  PRIMARY CARE PHYSICIAN: [facility] [place]    ADMISSION DIAGNOSIS:  Community acquired pneumonia, unspecified laterality [J18.9]  DISCHARGE DIAGNOSIS:  Active Problems:   PNA (pneumonia)   SECONDARY DIAGNOSIS:   Past Medical History:  Diagnosis Date  . No known health problems   . Post-operative nausea and vomiting 02/10/2018    HOSPITAL COURSE:   22 year old female with no significant past medical history who presented to the hospital due to cough and shortness of breath.  1.  Pneumonia-this was a source of patient's cough and shortness of breath.  Patient's flu test was negative.  Her respiratory panel was also negative.  Her chest x-ray was suggestive of pneumonia. - Patient was treated with IV ceftriaxone, Zithromax.  She has improved.  She was given some nebulizer treatments for her bronchospasm and has also improved. - She was ambulated is no longer hypoxic or bronchospastic.  She was discharged home on oral Levaquin for a few days.  2.  Sinus tachycardia-secondary to the respiratory illness as mentioned above.  This is improved with IV fluid hydration and treatment for underlying pneumonia.  DISCHARGE CONDITIONS:   Stable  CONSULTS OBTAINED:    DRUG ALLERGIES:   Allergies  Allergen Reactions  . Penicillins     Has patient had a PCN reaction causing immediate rash, facial/tongue/throat swelling, SOB or lightheadedness with hypotension: Unknown Has patient had a PCN reaction causing severe rash involving mucus membranes or skin necrosis: Unknown Has patient had a PCN reaction that required hospitalization: Unknown Has patient had a PCN reaction occurring within the last 10 years: Unknown If all of the above  answers are "NO", then may proceed with Cephalosporin use.    DISCHARGE MEDICATIONS:   Allergies as of 05/29/2018      Reactions   Penicillins    Has patient had a PCN reaction causing immediate rash, facial/tongue/throat swelling, SOB or lightheadedness with hypotension: Unknown Has patient had a PCN reaction causing severe rash involving mucus membranes or skin necrosis: Unknown Has patient had a PCN reaction that required hospitalization: Unknown Has patient had a PCN reaction occurring within the last 10 years: Unknown If all of the above answers are "NO", then may proceed with Cephalosporin use.      Medication List    TAKE these medications   albuterol 108 (90 Base) MCG/ACT inhaler Commonly known as:  PROVENTIL HFA;VENTOLIN HFA Inhale 2 puffs into the lungs every 6 (six) hours as needed for wheezing or shortness of breath.   levofloxacin 750 MG tablet Commonly known as:  LEVAQUIN Take 1 tablet (750 mg total) by mouth daily for 6 days.         DISCHARGE INSTRUCTIONS:   DIET:  Regular diet  DISCHARGE CONDITION:  Stable  ACTIVITY:  Activity as tolerated  OXYGEN:  Home Oxygen: No.   Oxygen Delivery: room air  DISCHARGE LOCATION:  home   If you experience worsening of your admission symptoms, develop shortness of breath, life threatening emergency, suicidal or homicidal thoughts you must seek medical attention immediately by calling 911 or calling your MD immediately  if symptoms less severe.  You Must read complete instructions/literature along with all the possible adverse reactions/side effects for all the Medicines you take  and that have been prescribed to you. Take any new Medicines after you have completely understood and accpet all the possible adverse reactions/side effects.   Please note  You were cared for by a hospitalist during your hospital stay. If you have any questions about your discharge medications or the care you received while you were in  the hospital after you are discharged, you can call the unit and asked to speak with the hospitalist on call if the hospitalist that took care of you is not available. Once you are discharged, your primary care physician will handle any further medical issues. Please note that NO REFILLS for any discharge medications will be authorized once you are discharged, as it is imperative that you return to your primary care physician (or establish a relationship with a primary care physician if you do not have one) for your aftercare needs so that they can reassess your need for medications and monitor your lab values.     Today   Shortness of breath, cough has improved.  Afebrile, hemodynamically stable.  Ambulated and is no longer hypoxic.  Will discharge home today.  VITAL SIGNS:  Blood pressure (!) 119/58, pulse 94, temperature 98.3 F (36.8 C), temperature source Oral, resp. rate 20, height 5\' 3"  (1.6 m), weight 77.1 kg, SpO2 97 %, unknown if currently breastfeeding.  I/O:    Intake/Output Summary (Last 24 hours) at 05/29/2018 1525 Last data filed at 05/29/2018 0603 Gross per 24 hour  Intake 3229.35 ml  Output -  Net 3229.35 ml    PHYSICAL EXAMINATION:  GENERAL:  22 y.o.-year-old patient lying in the bed with no acute distress.  EYES: Pupils equal, round, reactive to light and accommodation. No scleral icterus. Extraocular muscles intact.  HEENT: Head atraumatic, normocephalic. Oropharynx and nasopharynx clear.  NECK:  Supple, no jugular venous distention. No thyroid enlargement, no tenderness.  LUNGS: Normal breath sounds bilaterally, no wheezing, rales,rhonchi. No use of accessory muscles of respiration.  CARDIOVASCULAR: S1, S2 normal. No murmurs, rubs, or gallops.  ABDOMEN: Soft, non-tender, non-distended. Bowel sounds present. No organomegaly or mass.  EXTREMITIES: No pedal edema, cyanosis, or clubbing.  NEUROLOGIC: Cranial nerves II through XII are intact. No focal motor or sensory  defecits b/l.  PSYCHIATRIC: The patient is alert and oriented x 3.  SKIN: No obvious rash, lesion, or ulcer.   DATA REVIEW:   CBC Recent Labs  Lab 05/29/18 0401  WBC 12.8*  HGB 11.6*  HCT 36.3  PLT 216    Chemistries  Recent Labs  Lab 05/28/18 1412 05/29/18 0401  NA 139 142  K 3.7 3.8  CL 105 112*  CO2 24 23  GLUCOSE 120* 101*  BUN 9 9  CREATININE 0.86 0.72  CALCIUM 9.1 8.2*  AST 17  --   ALT 22  --   ALKPHOS 71  --   BILITOT 1.3*  --     Cardiac Enzymes No results for input(s): TROPONINI in the last 168 hours.  Microbiology Results  Results for orders placed or performed during the hospital encounter of 05/28/18  Blood culture (routine x 2)     Status: None (Preliminary result)   Collection Time: 05/28/18  4:05 PM  Result Value Ref Range Status   Specimen Description BLOOD LEFT ANTECUBITAL  Final   Special Requests   Final    BOTTLES DRAWN AEROBIC AND ANAEROBIC Blood Culture results may not be optimal due to an inadequate volume of blood received in culture bottles   Culture  Final    NO GROWTH < 12 HOURS Performed at [facility], [address] [place], [place] [address]    Report Status PENDING  Incomplete  Blood culture (routine x 2)     Status: None (Preliminary result)   Collection Time: 05/28/18  4:14 PM  Result Value Ref Range Status   Specimen Description BLOOD BLOOD LEFT FOREARM  Final   Special Requests   Final    BOTTLES DRAWN AEROBIC AND ANAEROBIC Blood Culture results may not be optimal due to an inadequate volume of blood received in culture bottles   Culture   Final    NO GROWTH < 12 HOURS Performed at [facility], [address] [place], [place] [address]    Report Status PENDING  Incomplete  Respiratory Panel by PCR     Status: None   Collection Time: 05/28/18  5:55 PM  Result Value Ref Range Status   Adenovirus NOT DETECTED NOT DETECTED Final   Coronavirus 229E NOT DETECTED NOT DETECTED Final   Coronavirus  HKU1 NOT DETECTED NOT DETECTED Final   Coronavirus NL63 NOT DETECTED NOT DETECTED Final   Coronavirus OC43 NOT DETECTED NOT DETECTED Final   Metapneumovirus NOT DETECTED NOT DETECTED Final   Rhinovirus / Enterovirus NOT DETECTED NOT DETECTED Final   Influenza A NOT DETECTED NOT DETECTED Final   Influenza B NOT DETECTED NOT DETECTED Final   Parainfluenza Virus 1 NOT DETECTED NOT DETECTED Final   Parainfluenza Virus 2 NOT DETECTED NOT DETECTED Final   Parainfluenza Virus 3 NOT DETECTED NOT DETECTED Final   Parainfluenza Virus 4 NOT DETECTED NOT DETECTED Final   Respiratory Syncytial Virus NOT DETECTED NOT DETECTED Final   Bordetella pertussis NOT DETECTED NOT DETECTED Final   Chlamydophila pneumoniae NOT DETECTED NOT DETECTED Final   Mycoplasma pneumoniae NOT DETECTED NOT DETECTED Final    Comment: Performed at [facility], [address] [place], [place] [address]    RADIOLOGY:  Dg Chest 2 View  Result Date: 05/28/2018 CLINICAL DATA:  Cough, fever. EXAM: CHEST - 2 VIEW COMPARISON:  Radiographs of July 05, 2015. FINDINGS: The heart size and mediastinal contours are within normal limits. No pneumothorax or pleural effusion is noted. Mild bibasilar airspace opacities are noted concerning for pneumonia, right greater than left. The visualized skeletal structures are unremarkable. IMPRESSION: Mild bibasilar airspace opacities are noted concerning for pneumonia, right greater than left. Electronically Signed   By: Lupita RaiderJames  Green Jr, M.D.   On: 05/28/2018 15:15      Management plans discussed with the patient, family and they are in agreement.  CODE STATUS:     Code Status Orders  (From admission, onward)         Start     Ordered   05/28/18 1740  Full code  Continuous     05/28/18 1739         TOTAL TIME TAKING CARE OF THIS PATIENT: 40 minutes.    Houston SirenSAINANI, J M.D on 05/29/2018 at 3:25 PM  Between 7am to 6pm - Pager - [phone]  After 6pm go to [email or web address]  - Therapist, nutritionalpassword EPAS [facility]  [facility]  Office  [phone]  CC: Primary care physician; [facility], [place]

## 2018-05-29 NOTE — Discharge Planning (Signed)
Patient IV and tele removed.  RN assessment and VS revealed stability for DC to home.  Discharge papers given, explained and educated.  Informed of suggested FU appt with PCP - Patient agreed to contact and set up. Levequin script printed and given to patient.  Once ready, will be walked to front and family transporting home via car.

## 2018-05-30 ENCOUNTER — Telehealth: Payer: Self-pay

## 2018-05-30 LAB — HIV ANTIBODY (ROUTINE TESTING W REFLEX): HIV Screen 4th Generation wRfx: NONREACTIVE

## 2018-05-30 NOTE — Telephone Encounter (Signed)
Pt has been in hosp for pneumonia; d/c home c tablets too big for her to swallow; told to crush them and is still unable to get them down.  Can she have liquid a liquid form of anitbx for pneumonia?  [phone]

## 2018-05-30 NOTE — Telephone Encounter (Signed)
Pt aware.

## 2018-05-30 NOTE — Telephone Encounter (Signed)
She should contact the hospitalist who prescribed her medication as directed in her discharge summary to see if they can give her a different medication or for proper dosage of liquid medication if available.

## 2018-06-03 LAB — CULTURE, BLOOD (ROUTINE X 2)
Culture: NO GROWTH
Culture: NO GROWTH

## 2018-09-04 ENCOUNTER — Encounter: Payer: Self-pay | Admitting: Obstetrics & Gynecology

## 2018-09-05 ENCOUNTER — Encounter: Payer: Self-pay | Admitting: Obstetrics and Gynecology

## 2018-09-05 ENCOUNTER — Ambulatory Visit (INDEPENDENT_AMBULATORY_CARE_PROVIDER_SITE_OTHER): Payer: Medicaid Other | Admitting: Obstetrics and Gynecology

## 2018-09-05 ENCOUNTER — Other Ambulatory Visit: Payer: Self-pay

## 2018-09-05 VITALS — BP 120/70 | HR 109 | Ht 63.0 in | Wt 199.0 lb

## 2018-09-05 DIAGNOSIS — L409 Psoriasis, unspecified: Secondary | ICD-10-CM

## 2018-09-05 DIAGNOSIS — B373 Candidiasis of vulva and vagina: Secondary | ICD-10-CM

## 2018-09-05 DIAGNOSIS — B3731 Acute candidiasis of vulva and vagina: Secondary | ICD-10-CM

## 2018-09-05 MED ORDER — FLUCONAZOLE 150 MG PO TABS: 150.0000 mg | ORAL_TABLET | ORAL | 0 refills | Status: AC

## 2018-09-05 NOTE — Progress Notes (Signed)
Patient ID: Grace Griffin, female   DOB: 06/09/1995, 23 y.o.   MRN: [id number]  Reason for Consult: Vaginitis (Vaginal Itching and irriation, tried 7 day vaginal cream OTC)   [facility] by [facility],*  Subjective:     HPI:  Grace P Banker is a 23 y.o. female. She has been using an over the counter medication to treat a yeast infection for the last 7 days without improvement. She has not noticed as much discharge as vulvar and vaginal discomfort.   She also complains of a rash on her right hand. 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 It is always on her pointer, middle and pinkie finger.  She notes some itching and discomfort from the rash.   Past Medical History:  Diagnosis Date  . No known health problems   . Post-operative nausea and vomiting 02/10/2018   Family History  Problem Relation Age of Onset  . Breast cancer Paternal Grandmother   . Diabetes Father    Past Surgical History:  Procedure Laterality Date  . CESAREAN SECTION N/A 02/04/2018   Procedure: CESAREAN SECTION;  Surgeon: Nadara MustardHarris, Robert P, MD;  Location: [facility] ORS;  Service: Obstetrics;  Laterality: N/A;  . TONSILLECTOMY     AND ADENOIDECTOMY  . WISDOM TOOTH EXTRACTION      Short Social History:  Social History   Tobacco Use  . Smoking status: Never Smoker  . Smokeless tobacco: Never Used  Substance Use Topics  . Alcohol use: No    Allergies  Allergen Reactions  . Penicillins     Has patient had a PCN reaction causing immediate rash, facial/tongue/throat swelling, SOB or lightheadedness with hypotension: Unknown Has patient had a PCN reaction causing severe rash involving mucus membranes or skin necrosis: Unknown Has patient had a PCN reaction that required hospitalization: Unknown Has patient had a PCN reaction occurring within the last 10 years: Unknown If all of the above answers are "NO", then may proceed with  Cephalosporin use.    Current Outpatient Medications  Medication Sig Dispense Refill  . albuterol (PROVENTIL HFA;VENTOLIN HFA) 108 (90 Base) MCG/ACT inhaler Inhale 2 puffs into the lungs every 6 (six) hours as needed for wheezing or shortness of breath.    . levonorgestrel (MIRENA) 20 MCG/24HR IUD 1 each by Intrauterine route once.    . fluconazole (DIFLUCAN) 150 MG tablet Take 1 tablet (150 mg total) by mouth every 3 (three) days for 2 doses. 2 tablet 0   No current facility-administered medications for this visit.     Review of Systems  Constitutional: Negative for chills, fatigue, fever and unexpected weight change.  HENT: Negative for trouble swallowing.  Eyes: Negative for loss of vision.  Respiratory: Negative for cough, shortness of breath and wheezing.  Cardiovascular: Negative for chest pain, leg swelling, palpitations and syncope.  GI: Negative for abdominal pain, blood in stool, diarrhea, nausea and vomiting.  GU: Negative for difficulty urinating, dysuria, frequency and hematuria.  Musculoskeletal: Negative for back pain, leg pain and joint pain.  Skin: Positive for rash.  Neurological: Negative for dizziness, headaches, light-headedness, numbness and seizures.  Psychiatric: Negative for behavioral problem, confusion, depressed mood and sleep disturbance.        Objective:  Objective   Vitals:   09/05/18 0847  BP: 120/70  Pulse: (!) 109  Weight: 199 lb (90.3 kg)  Height: 5\' 3"  (1.6 m)   Body mass index is 35.25  kg/m.  Physical Exam Vitals signs and nursing note reviewed.  Constitutional:      Appearance: She is well-developed.  HENT:     Head: Normocephalic and atraumatic.  Eyes:     Pupils: Pupils are equal, round, and reactive to light.  Cardiovascular:     Rate and Rhythm: Normal rate and regular rhythm.  Pulmonary:     Effort: Pulmonary effort is normal. No respiratory distress.  Abdominal:     General: Abdomen is flat. There is no distension.      Palpations: Abdomen is soft. There is no mass.  Genitourinary:    General: Normal vulva.     Comments: scant white thick vaginal discharge Grossly normal cervix IUD strings visible  No bleeding seen No lesions Musculoskeletal:       Arms:  Skin:    General: Skin is warm and dry.  Neurological:     Mental Status: She is alert and oriented to person, place, and time.  Psychiatric:        Behavior: Behavior normal.        Thought Content: Thought content normal.        Judgment: Judgment normal.     Wet Prep Clue Cells: Negative Fungal elements: Positive Trichomonas: Negative      Assessment/Plan:     23 yo G1P1001 1. Yeast vaginitis rx for diflucan sent. 2. Rash- appears to be psoriasis, referral to dermatology made.  3. Follow up for pap smear in 6-7 months.  More than 25 minutes were spent face to face with the patient in the room with more than 50% of the time spent providing counseling and discussing the plan of management.      Adelene Idler MD [facility], [facility] 09/05/2018 9:29 AM

## 2018-09-05 NOTE — Addendum Note (Signed)
Addended by: Adelene Idler on: 09/05/2018 04:51 PM   Modules accepted: Orders

## 2018-09-08 LAB — NUSWAB VAGINITIS PLUS (VG+)
Candida albicans, NAA: NEGATIVE
Candida glabrata, NAA: NEGATIVE
Chlamydia trachomatis, NAA: NEGATIVE
Neisseria gonorrhoeae, NAA: NEGATIVE
Trich vag by NAA: NEGATIVE

## 2018-09-09 NOTE — Progress Notes (Signed)
Normal, released to mychart

## 2018-10-05 ENCOUNTER — Ambulatory Visit: Payer: Medicaid Other | Admitting: Obstetrics & Gynecology

## 2018-12-19 ENCOUNTER — Other Ambulatory Visit: Payer: Self-pay | Admitting: Obstetrics & Gynecology

## 2018-12-19 ENCOUNTER — Telehealth: Payer: Self-pay

## 2018-12-19 MED ORDER — FLUCONAZOLE 150 MG PO TABS: 150.0000 mg | ORAL_TABLET | Freq: Once | ORAL | 3 refills | Status: AC

## 2018-12-19 NOTE — Telephone Encounter (Signed)
Can you send in diflucan for this pt?

## 2018-12-19 NOTE — Telephone Encounter (Signed)
Called pt no voicemail box set up

## 2018-12-19 NOTE — Telephone Encounter (Signed)
ERx done.  She does need appt for f/u PAP when she can (will she have insurance again soon?)

## 2018-12-19 NOTE — Telephone Encounter (Signed)
Pt calling; has question for the nurse.  [phone]  Pt doesn't have ins and cannot afford to pay for office visit and medicine. Wants medicine that was called in for her the last time she had a yeast inf - diflucan.  She is using what the HD gave her.  It has helped but it hasn't completely gone away.

## 2018-12-21 NOTE — Telephone Encounter (Signed)
Pt had a question about her diflucan, wanted to know was she only supposed to get just one pill, pt aware to call us tomorrow if still had symptoms ,

## 2018-12-21 NOTE — Telephone Encounter (Signed)
Pt calling; has question about the pill she took the other day.  [phone]

## 2018-12-28 ENCOUNTER — Emergency Department
Admission: EM | Admit: 2018-12-28 | Discharge: 2018-12-28 | Disposition: A | Discharge status: Home / Self Care | Payer: No Typology Code available for payment source | Attending: Emergency Medicine | Admitting: Emergency Medicine

## 2018-12-28 ENCOUNTER — Other Ambulatory Visit: Payer: Self-pay

## 2018-12-28 ENCOUNTER — Encounter: Payer: Self-pay | Admitting: Emergency Medicine

## 2018-12-28 ENCOUNTER — Emergency Department: Payer: No Typology Code available for payment source

## 2018-12-28 DIAGNOSIS — Z79899 Other long term (current) drug therapy: Secondary | ICD-10-CM | POA: Insufficient documentation

## 2018-12-28 DIAGNOSIS — R0789 Other chest pain: Secondary | ICD-10-CM | POA: Diagnosis not present

## 2018-12-28 DIAGNOSIS — M542 Cervicalgia: Secondary | ICD-10-CM | POA: Diagnosis present

## 2018-12-28 MED ORDER — ACETAMINOPHEN 500 MG PO TABS
1000.0000 mg | ORAL_TABLET | Freq: Once | ORAL | Status: AC
  Administered 2018-12-28: 1000 mg via ORAL
  Filled 2018-12-28: qty 2

## 2018-12-28 MED ORDER — METHOCARBAMOL 500 MG PO TABS: 500.0000 mg | ORAL_TABLET | Freq: Three times a day (TID) | ORAL | 0 refills | Status: AC | PRN

## 2018-12-28 MED ORDER — MELOXICAM 15 MG PO TABS: 15.0000 mg | ORAL_TABLET | Freq: Every day | ORAL | 1 refills | Status: AC
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
## 2018-12-28 NOTE — ED Notes (Signed)

## 2019-03-01 ENCOUNTER — Telehealth: Payer: Self-pay

## 2019-03-01 NOTE — Telephone Encounter (Signed)
Pt has a question.  [phone]

## 2019-03-02 ENCOUNTER — Other Ambulatory Visit: Payer: Self-pay | Admitting: Advanced Practice Midwife

## 2019-03-02 ENCOUNTER — Encounter: Payer: Self-pay | Admitting: Obstetrics & Gynecology

## 2019-03-02 DIAGNOSIS — B379 Candidiasis, unspecified: Secondary | ICD-10-CM

## 2019-03-02 MED ORDER — FLUCONAZOLE 150 MG PO TABS: 150.0000 mg | ORAL_TABLET | Freq: Once | ORAL | 1 refills | Status: AC

## 2019-03-02 NOTE — Telephone Encounter (Signed)
Returned pt's call - vm not set up.

## 2019-03-02 NOTE — Telephone Encounter (Addendum)
Pt returned my earlier call; she tried the 3d monistat with little relief; 7d last time no relief.  Hopes to sign up for ins next week.  Can the same rx as last time be sent in again? Is there an antifungal she can take daily for prevention?  [phone]

## 2019-03-02 NOTE — Telephone Encounter (Signed)
Patient is calling to speak with a nurse. No message left. Please advise

## 2019-03-02 NOTE — Telephone Encounter (Signed)
Patient following up, hadn't heard back and noticed it getting close to 5, if she could get return call asap.

## 2019-03-02 NOTE — Progress Notes (Unsigned)
Rx diflucan sent to patient's pharmacy. 

## 2019-03-06 NOTE — Telephone Encounter (Signed)
VM not set up yet.

## 2019-03-07 ENCOUNTER — Ambulatory Visit: Payer: Self-pay | Admitting: Obstetrics and Gynecology

## 2019-03-09 NOTE — Telephone Encounter (Signed)
Pt cancelled 03/07/19 appt.  Msg closed.

## 2019-04-18 ENCOUNTER — Telehealth: Payer: Self-pay | Admitting: Family Medicine

## 2019-04-18 NOTE — Telephone Encounter (Signed)
patient wants to get a pap done but is constantly on her periods and would like to know if that will get in the way of anything.

## 2019-04-18 NOTE — Telephone Encounter (Signed)
This RN returned pt's call at phone # provided. Pt reports that she had a Mirena placed at [facility] about a year ago and is still having spotting and menstraul bleeding with it. Pt reports some days it's heavy and some days it's just spotting. Pt also reports that she had an abnormal pap with [facility] around 02/2018 and believes she had a colpo after that and was needing to go back for a repeat pap recently but missed her appt and does not have insurance so is wondering about coming to [facility] for that. Counseled pt that I would speak with provider about concerns and give her a call back. Pt states understanding. Consulted with Antoine Primas, PA regarding conversation with pt. Per Antoine Primas, PA pt can come in for pap and to be seen regarding issues with bleeding and mirena and that if pt desires to try Ibuprofen for menstraul bleeding with IUD that she could take Ibuprofen 800mg  every 8 hours with food or milk. This RN called pt back and counseled pt per provider and pt states understanding. Pt desires to come in tomorrow 04/19/2019 to be seen. FP Prob visit scheduled for 04/19/2019 at 3:05pm per pt request. Pt aware of appt date and time and to arrive early for check-in. Pt with no other questions or concerns at this time.Ronny Bacon, RN

## 2019-04-19 ENCOUNTER — Ambulatory Visit: Payer: Self-pay | Admitting: Family Medicine

## 2019-04-19 ENCOUNTER — Other Ambulatory Visit: Payer: Self-pay

## 2019-04-19 VITALS — BP 106/69 | Ht 64.0 in | Wt 203.4 lb

## 2019-04-19 DIAGNOSIS — Z3009 Encounter for other general counseling and advice on contraception: Secondary | ICD-10-CM

## 2019-04-19 DIAGNOSIS — Z975 Presence of (intrauterine) contraceptive device: Secondary | ICD-10-CM

## 2019-04-19 DIAGNOSIS — N921 Excessive and frequent menstruation with irregular cycle: Secondary | ICD-10-CM

## 2019-04-19 DIAGNOSIS — Z124 Encounter for screening for malignant neoplasm of cervix: Secondary | ICD-10-CM

## 2019-04-19 DIAGNOSIS — Z113 Encounter for screening for infections with a predominantly sexual mode of transmission: Secondary | ICD-10-CM

## 2019-04-19 LAB — WET PREP FOR TRICH, YEAST, CLUE
Trichomonas Exam: NEGATIVE
Yeast Exam: NEGATIVE

## 2019-04-19 NOTE — Telephone Encounter (Signed)
Phone call to pt. Pt states she has an appt for today and will be coming in for her appt. No further assistance needed by RN at this time.

## 2019-04-19 NOTE — Progress Notes (Signed)
Here today to discuss irregular bleeding concerns regarding IUD. Last PE, Pap Smear and Mirena IUD placement was 03/14/2018 @ [facility]. Colposcopy @ [facility] 03/31/2018 for abnormal Pap Smear follow up. Hal Morales, RN

## 2019-04-19 NOTE — Progress Notes (Signed)
Family Planning Visit- Repeat Yearly Visit  Subjective:  Grace Griffin is a 23 y.o. being seen today for an well woman visit and to discuss family planning options.    She is currently using IUD for pregnancy prevention. Patient reports she does not if she or her partner wants a pregnancy in the next year. Patient  has BMI 34.0-34.9,adult; Intractable nausea and vomiting; and LGSIL on Pap smear of cervix on their problem list.  Chief Complaint  Patient presents with  . Gynecologic Exam    IUD concerns and Pap Smear    Patient reports she has had spotting with mirena since it was placed last year. Reports ~6-10 days of spotting per month, unpredictable when it comes. Patient denies large loss of blood or dizziness.    Also here for pap, follow up to colpo done 03/2018 with instructions to repeat pap q6 months. She believes she may have missed a recent pap appointment, but unsure when it was for.  Does the patient desire a pregnancy in the next year? (OKQ flowsheet) No  See flowsheet for other program required questions.   Body mass index is 34.91 kg/m. - Patient is eligible for diabetes screening based on BMI and age >22?  no HA1C ordered? not applicable  Patient reports 1 of partners in last year. Desires STI screening?  Yes  Does the patient have a current or past history of drug use? No   No components found for: HCV]   Health Maintenance Due  Topic Date Due  . INFLUENZA VACCINE  12/30/2018  . CHLAMYDIA SCREENING  03/15/2019    ROS  The following portions of the patient's history were reviewed and updated as appropriate: allergies, current medications, past family history, past medical history, past social history, past surgical history and problem list. Problem list updated.  Objective:   Vitals:   04/19/19 1502  BP: 106/69  Weight: 203 lb 6.4 oz (92.3 kg)  Height: 5\' 4"  (1.626 m)    Physical Exam Vitals signs and nursing note reviewed.  Constitutional:     Appearance: Normal appearance.  HENT:     Head: Normocephalic.     Mouth/Throat:     Mouth: Mucous membranes are moist.     Pharynx: Oropharynx is clear.  Eyes:     Conjunctiva/sclera: Conjunctivae normal.  Pulmonary:     Effort: Pulmonary effort is normal.  Chest:     Chest wall: No mass or tenderness.     Breasts:        Right: Normal. No swelling, inverted nipple, mass, nipple discharge, skin change or tenderness.        Left: Normal. No swelling, inverted nipple, mass, nipple discharge, skin change or tenderness.  Abdominal:     General: Abdomen is flat.     Palpations: Abdomen is soft.  Genitourinary:    General: Normal vulva.     Exam position: Lithotomy position.     Pubic Area: No rash.      Labia:        Right: No rash, tenderness or lesion.        Left: No tenderness or lesion.      Vagina: Normal.     Cervix: Cervical bleeding present. No lesion.  Musculoskeletal: Normal range of motion.  Lymphadenopathy:     Upper Body:     Right upper body: No supraclavicular, axillary or pectoral adenopathy.     Left upper body: No supraclavicular, axillary or pectoral adenopathy.  Lower Body: No right inguinal adenopathy. No left inguinal adenopathy.  Skin:    General: Skin is warm and dry.  Neurological:     Mental Status: She is alert. Mental status is at baseline.  Psychiatric:        Behavior: Behavior normal.        Thought Content: Thought content normal.       Assessment and Plan:  Grace Griffin is a 23 y.o. female presenting to the [facility] Department for an initial well woman exam/family planning visit  Contraception counseling: Reviewed all forms of birth control options available including abstinence; over the counter/barrier methods; hormonal contraceptive medication including pill, patch, ring, injection,contraceptive implant; hormonal and nonhormonal IUDs; permanent sterilization options including vasectomy and the various tubal  sterilization modalities. Risks and benefits reviewed.  Questions were answered.  Written information was also given to the patient to review.  Patient desires to stay with IUD for now. She will follow up in  In 1 year for surveillance.  She was told to call with any further questions, or with any concerns about this method of contraception.  Emphasized use of condoms 100% of the time for STI prevention.   1. Screening for cervical cancer -Per colpo note from 03/31/2018 pt was to have q6 mo paps following colpo, though this is first presentation since then. Pap collected today.   - Pap IG (Image Guided)  2. Breakthrough bleeding with IUD -We discussed options for attempts to lessen spotting, including ibuprofen or OCPs. She elects to try ibuprofen 600mg  three times per day at first sign of spotting xup to 7 days. RTC if bleeding proves persistent and unacceptable to discuss alternative methods.   3. Screen for STD (sexually transmitted disease) -Pt requests screening as below, declines HIV and syphilis screening. - Chlamydia/Gonorrhea [place] [facility] - WET PREP FOR [place], [place]    Return in about 1 year (around 04/18/2020) for yearly wellness exam.  No future appointments.  Kandee Keen, PA-C

## 2019-04-19 NOTE — Telephone Encounter (Signed)
Consulted by RN re:  Patient request to come into [facility] for irregular bleeding with the Mirena and repeat pap.  Reviewed RN documentation and agree with advice documented.  Would add that patient should only use IB to help with bleeding for 5 days in a row.

## 2019-04-20 ENCOUNTER — Encounter: Payer: Self-pay | Admitting: Family Medicine

## 2019-04-28 LAB — PAP IG (IMAGE GUIDED): PAP Smear Comment: 0

## 2019-04-30 ENCOUNTER — Encounter: Payer: Self-pay | Admitting: Family Medicine

## 2019-04-30 DIAGNOSIS — R87619 Unspecified abnormal cytological findings in specimens from cervix uteri: Secondary | ICD-10-CM | POA: Insufficient documentation

## 2019-05-04 ENCOUNTER — Encounter: Payer: Self-pay | Admitting: Family Medicine

## 2019-05-11 ENCOUNTER — Other Ambulatory Visit: Payer: Self-pay | Admitting: Obstetrics & Gynecology

## 2019-05-11 MED ORDER — FLUCONAZOLE 150 MG PO TABS: 150.0000 mg | ORAL_TABLET | Freq: Once | ORAL | 3 refills | Status: AC

## 2019-05-22 ENCOUNTER — Encounter: Payer: Self-pay | Admitting: Emergency Medicine

## 2019-05-22 ENCOUNTER — Other Ambulatory Visit: Payer: Self-pay

## 2019-05-22 ENCOUNTER — Emergency Department
Admission: EM | Admit: 2019-05-22 | Discharge: 2019-05-22 | Disposition: A | Discharge status: Home / Self Care | Payer: Self-pay | Attending: Emergency Medicine | Admitting: Emergency Medicine

## 2019-05-22 DIAGNOSIS — R070 Pain in throat: Secondary | ICD-10-CM | POA: Insufficient documentation

## 2019-05-22 DIAGNOSIS — Z5321 Procedure and treatment not carried out due to patient leaving prior to being seen by health care provider: Secondary | ICD-10-CM | POA: Insufficient documentation

## 2019-05-22 NOTE — ED Notes (Signed)
Patient called, no answer. Patient not seen in lobby or outside.  

## 2019-05-22 NOTE — ED Notes (Signed)
Patient called, no answer. Patient not in lobby, bathroom, or outside.

## 2019-05-22 NOTE — ED Notes (Signed)
Patient called, no answer.

## 2019-05-22 NOTE — ED Triage Notes (Addendum)
Patient ambulatory to triage with steady gait, without difficulty or distress noted, mask in place; pt reports after eating at [facility], "a chip went down wrong"; since has had throat irritation; able to swallow liquids and solids without difficulty--st has eaten bread and drank liquids without difficulty staying down

## 2019-07-17 ENCOUNTER — Telehealth: Payer: Self-pay

## 2019-07-17 NOTE — Telephone Encounter (Signed)
Pt calling; has questions about something that's been going on.  [phone]  VM not set up yet.

## 2019-07-18 NOTE — Telephone Encounter (Signed)
Second attempt to reach pt - "vm not set up yet'.

## 2019-07-23 ENCOUNTER — Other Ambulatory Visit: Payer: Self-pay | Admitting: Otolaryngology

## 2019-07-23 DIAGNOSIS — R1314 Dysphagia, pharyngoesophageal phase: Secondary | ICD-10-CM

## 2019-07-23 DIAGNOSIS — K219 Gastro-esophageal reflux disease without esophagitis: Secondary | ICD-10-CM

## 2019-08-01 NOTE — Telephone Encounter (Signed)
Pt hasn't contacted office.  Msg closed.

## 2019-08-06 ENCOUNTER — Ambulatory Visit: Admission: RE | Admit: 2019-08-06 | Payer: No Typology Code available for payment source | Source: Ambulatory Visit

## 2019-08-23 ENCOUNTER — Other Ambulatory Visit: Payer: Self-pay | Admitting: Obstetrics & Gynecology

## 2019-08-23 MED ORDER — FLUCONAZOLE 150 MG PO TABS: 150.0000 mg | ORAL_TABLET | Freq: Once | ORAL | 3 refills | Status: AC

## 2019-08-23 NOTE — Telephone Encounter (Signed)
Sch Annual plz PH

## 2019-08-24 NOTE — Telephone Encounter (Signed)
Patient is schedule for 09/17/19 at 2:30

## 2019-09-03 IMAGING — CT CT ABD-PELV W/ CM
2 of 5 series · 16 of 46 positions shown, 18 images · IV contrast (APPLIED)
Comparison: None.

CLINICAL DATA: Nausea vomiting postop C-section

EXAM:
CT ABDOMEN AND PELVIS WITH CONTRAST
TECHNIQUE: Multidetector CT imaging of the abdomen and pelvis was performed
using the standard protocol following bolus administration of
intravenous contrast.
CONTRAST:  100mL UKAVHL-T11 IOPAMIDOL (UKAVHL-T11) INJECTION 61%

[Series 2: routine abd/pel with · axial · 0.88mm/px · z∈[-328,+87]mm · 13 of 97 slices shown, 15 images]
[im 7/97  soft-tissue]
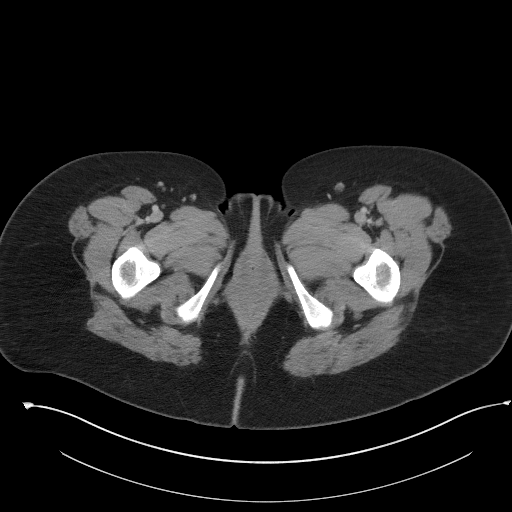
[im 7/97  bone]
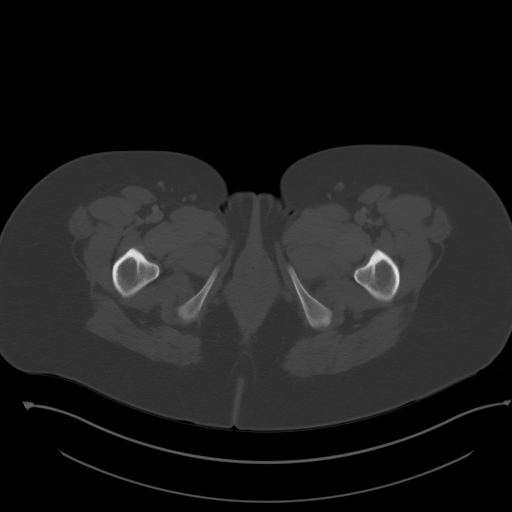
[im 13/97  soft-tissue]
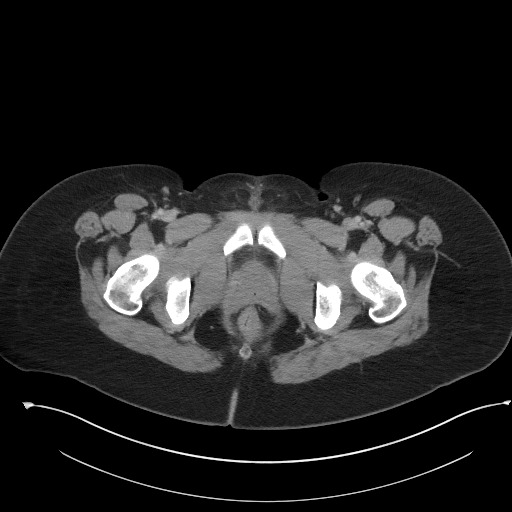
[im 20/97  soft-tissue]
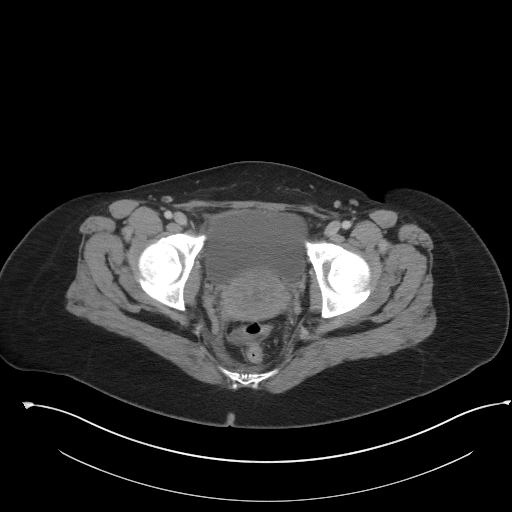
[im 26/97  soft-tissue]
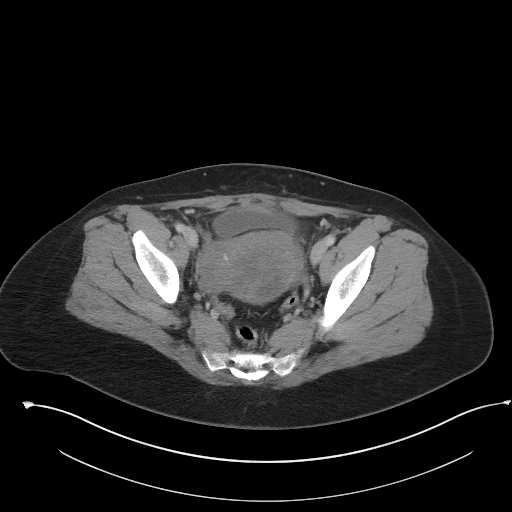
[im 33/97  soft-tissue]
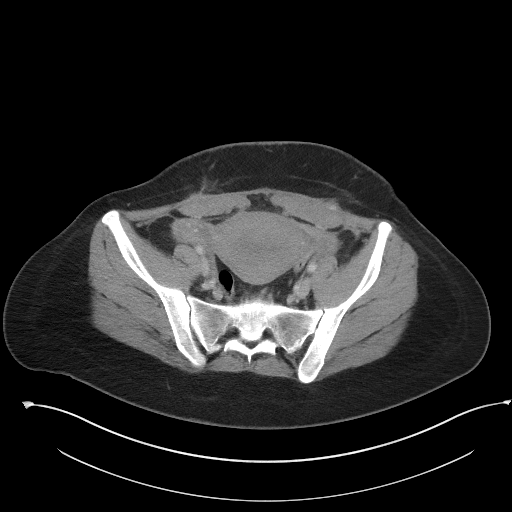
[im 39/97  soft-tissue]
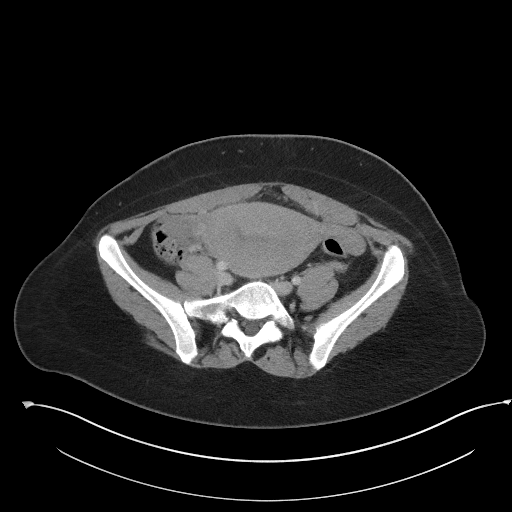
[im 52/97  soft-tissue]
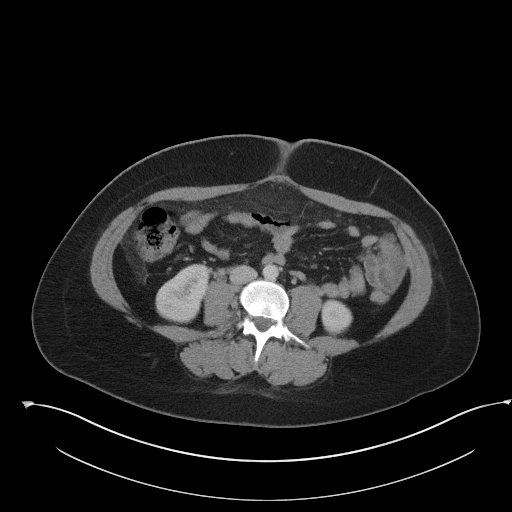
[im 58/97  soft-tissue]
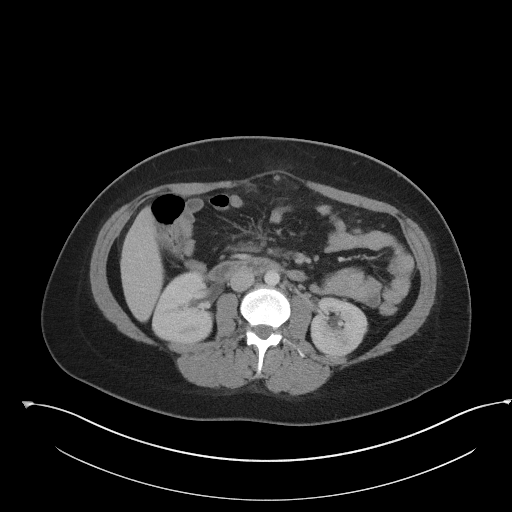
[im 65/97  soft-tissue]
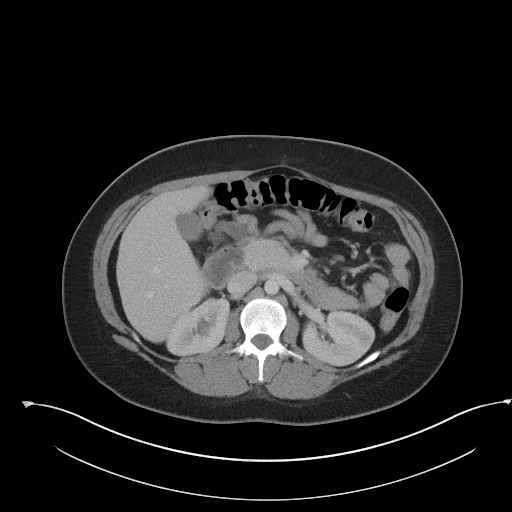
[im 65/97  bone]
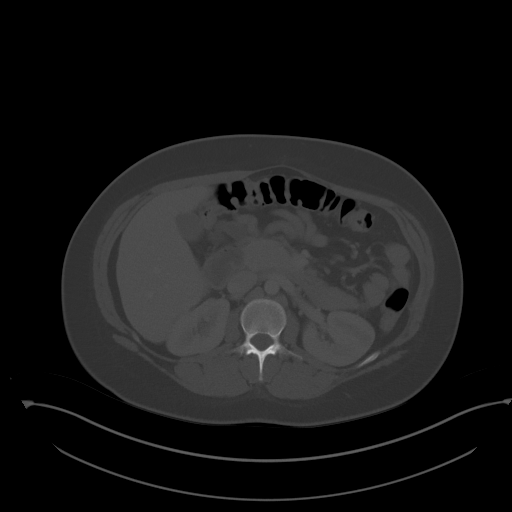
[im 71/97  soft-tissue]
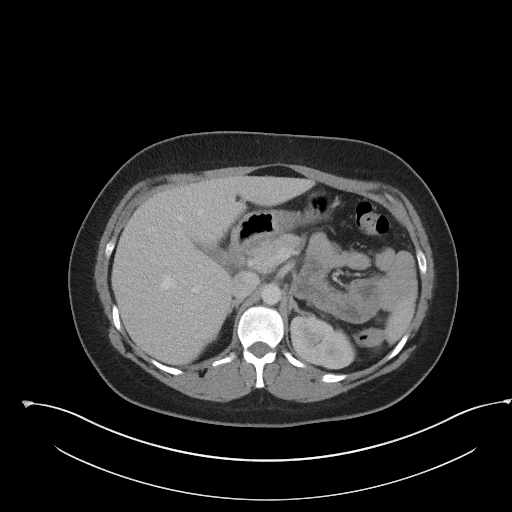
[im 77/97  soft-tissue]
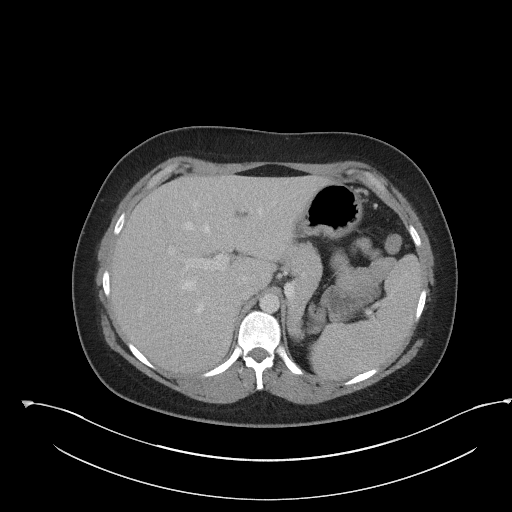
[im 84/97  soft-tissue]
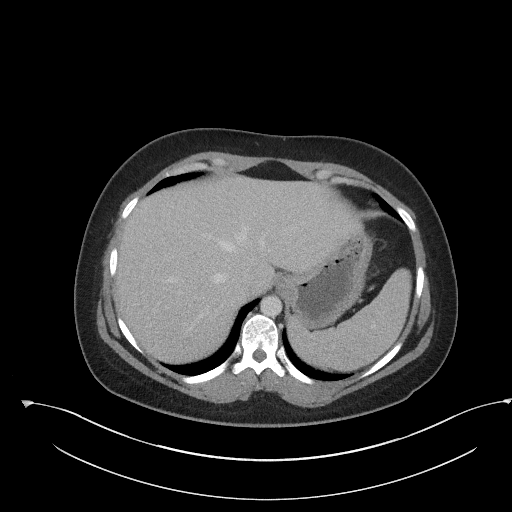
[im 90/97  soft-tissue]
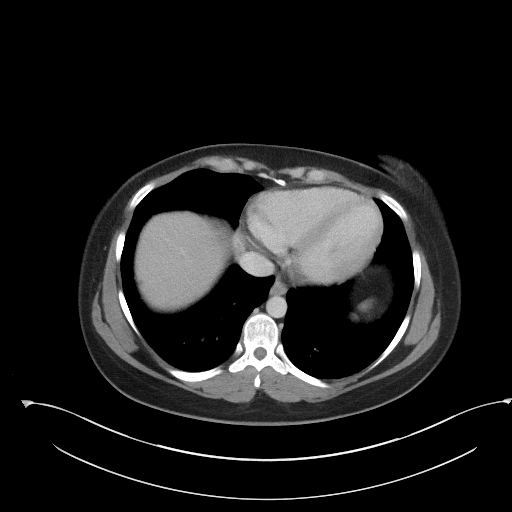

[Series 5: coronal st · coronal · 0.81mm/px · 3 of 83 slices shown]
[im 28/83  soft-tissue]
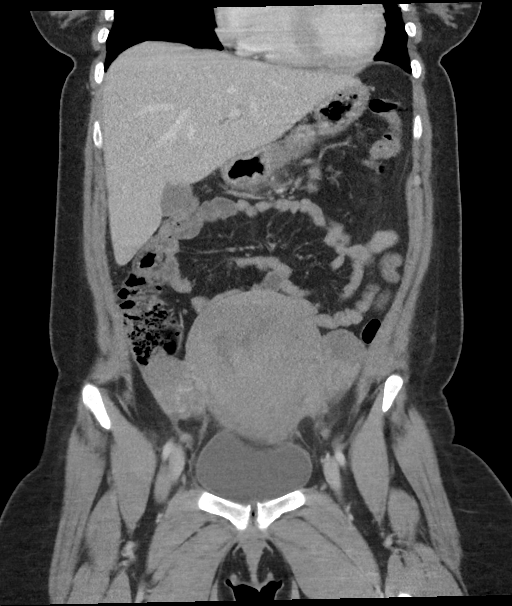
[im 37/83  soft-tissue]
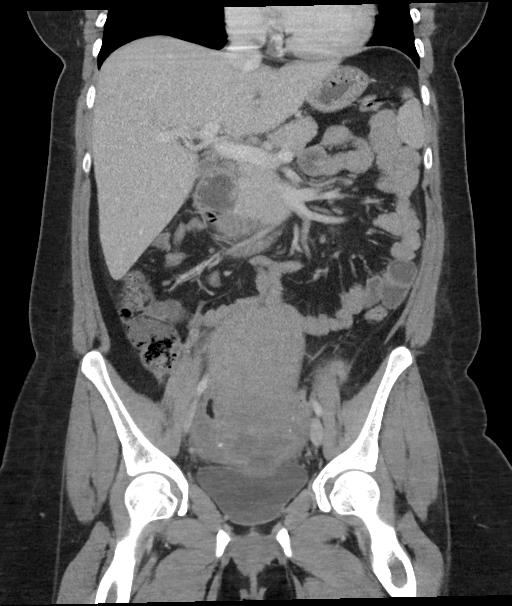
[im 46/83  soft-tissue]
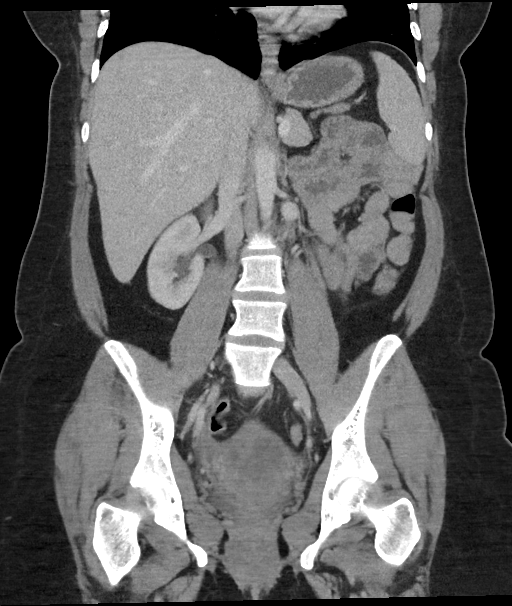

[16 of 46 positions shown; findings below may reference images not displayed]

FINDINGS: Lower chest: Lung bases demonstrate no acute consolidation or
effusion. The heart size is normal.

Hepatobiliary: Subcentimeter hypodensity posterior dome of the liver
too small to further characterize. No calcified stone or biliary
dilatation

Pancreas: Unremarkable. No pancreatic ductal dilatation or
surrounding inflammatory changes.

Spleen: Normal in size without focal abnormality.

Adrenals/Urinary Tract: Adrenal glands are normal. Mild right
hydronephrosis and hydroureter, likely due to recent pregnancy.
Bladder unremarkable

Stomach/Bowel: The stomach is nonenlarged. No dilated small bowel.
No colon wall thickening. Appendix not well seen but no right lower
quadrant inflammatory process.

Vascular/Lymphatic: Nonaneurysmal aorta.  No significant adenopathy

Reproductive: Enlarged uterus consistent with recent postpartum
status. Heterogenous complex collection within the lower uterine
segment and cervix measuring approximately 5.1 x 2.7 by 5.1 cm. No
adnexal mass. Soft tissue fullness in the right lower quadrant
likely reflects prominent adnexal vessels.

Other: No free air. Edema within the rectus sheath and mild haziness
within the anterior abdominal and pelvic fat likely due to resolving
postoperative change.

Musculoskeletal: No acute or significant osseous findings.
IMPRESSION: 1. Negative for bowel obstruction or bowel wall thickening.
2. Postpartum uterus with history of Caesarean section.
Heterogeneous complex collection in the lower uterine segment and
cervix measuring 5.1 cm, possibly representing a hematoma.
3. Mild right hydronephrosis and hydroureter, likely related to
recent pregnancy.

## 2019-09-06 ENCOUNTER — Ambulatory Visit
Admission: RE | Admit: 2019-09-06 | Discharge: 2019-09-06 | Disposition: A | Discharge status: Home / Self Care | Payer: Medicaid Other | Source: Ambulatory Visit | Attending: Otolaryngology | Admitting: Otolaryngology

## 2019-09-06 ENCOUNTER — Other Ambulatory Visit: Payer: Self-pay

## 2019-09-06 DIAGNOSIS — R1314 Dysphagia, pharyngoesophageal phase: Secondary | ICD-10-CM

## 2019-09-06 DIAGNOSIS — K219 Gastro-esophageal reflux disease without esophagitis: Secondary | ICD-10-CM | POA: Insufficient documentation

## 2019-09-06 NOTE — Therapy (Signed)
[place], [place], [address] Phone: [phone]   Fax:     Modified Barium Swallow  Patient Details  Name: Grace Griffin MRN: [id number] Date of Birth: 11-Sep-1995 No data recorded  Encounter Date: 09/06/2019  End of Session - 09/06/19 1307    Visit Number  1    Number of Visits  1    Date for SLP Re-Evaluation  09/06/19    SLP Start Time  11    SLP Stop Time   2426    SLP Time Calculation (min)  37 min    Activity Tolerance  Patient tolerated treatment well       Past Medical History:  Diagnosis Date  . No known health problems   . Post-operative nausea and vomiting 02/10/2018    Past Surgical History:  Procedure Laterality Date  . CESAREAN SECTION N/A 02/04/2018   Procedure: CESAREAN SECTION;  Surgeon: Gae Dry, MD;  Location: [facility] ORS;  Service: Obstetrics;  Laterality: N/A;  . TONSILLECTOMY     AND ADENOIDECTOMY  . WISDOM TOOTH EXTRACTION      There were no vitals filed for this visit.   Subjective: Patient behavior: (alertness, ability to follow instructions, etc.): the patient is alert, able to verbalize her swallowing complaints, and follow directions.  Chief complaint: Frequent choking while eating.  Patient reports significant anxiety over choking.   Objective:  Radiological Procedure: A videoflouroscopic evaluation of oral-preparatory, reflex initiation, and pharyngeal phases of the swallow was performed; as well as a screening of the upper esophageal phase.  I. POSTURE: Upright in MBS chair, standing  II. VIEW: Lateral, A-P  III. COMPENSATORY STRATEGIES: N/A  IV. BOLUSES ADMINISTERED:   Thin Liquid: 4 small consecutive   Nectar-thick Liquid: 2 small-moderate swallows   Honey-thick Liquid: DNT   Puree: 2 teaspoon presentations, 1 heaping teaspoon   Mechanical Soft: V. RESULTS OF EVALUATION: A. ORAL PREPARATORY PHASE: (The lips, tongue, and velum are  observed for strength and coordination)       **Overall Severity Rating: Mild; piecemeal swallowing with large boluses, abnormally small sips   B. SWALLOW INITIATION/REFLEX: (The reflex is normal if "triggered" by the time the bolus reached the base of the tongue)  **Overall Severity Rating: within normal limits   C. PHARYNGEAL PHASE: (Pharyngeal function is normal if the bolus shows rapid, smooth, and continuous transit through the pharynx and there is no pharyngeal residue after the swallow)  **Overall Severity Rating: within normal limits   D. LARYNGEAL PENETRATION: (Material entering into the laryngeal inlet/vestibule but not aspirated)  None  E. ASPIRATION: None  F. ESOPHAGEAL PHASE: (Screening of the upper esophagus) An esophageal sweep in the upright position with liquid and solid consistencies showed mid- and distal-esophageal retention with retrograde flow below the level of the pharyngoesophageal segment.    ASSESSMENT: This 24 year old woman; with frequent choking while eating; is presenting with functional oropharyngeal swallowing.  With exception of exceptionally small sips and piecemeal swallowing of large boluses, oral control of the bolus including oral hold, rotary mastication, and anterior to posterior transfer is within functional limits.   Timing of pharyngeal swallow initiation is within normal limits. Aspects of the pharyngeal stage of swallowing including tongue base retraction, hyolaryngeal excursion, epiglottic inversion, and duration/amplitude of UES opening are within normal limits.  There is no observed pharyngeal residue, laryngeal penetration, or tracheal aspiration.  The patient is not at risk for prandial aspiration.  Piecemeal swallowing is  typically due to fear that it will be problematic to swallow rather than neuromuscular dysphagia.  The patient was counseled that her swallowing is safe and she can relax when gets the choking sensation.  An esophageal sweep in  the upright position with liquid and solid consistencies showed mid- and distal-esophageal retention with retrograde flow below the level of the pharyngoesophageal segment.   The patient may benefit from further evaluation with GI to assess esophageal function.  PLAN/RECOMMENDATIONS:   A. Diet: Regular   B. Swallowing Precautions: No special precautions indicated- swallowing is safe   C. Recommended consultation to: GI   D. Therapy recommendations: speech therapy is not indicated   E. Results and recommendations were discussed with the patient immediately following the study and the final report routed to the referring MD and patient's PCP.      Dysphagia, pharyngoesophageal phase - Plan: DG SWALLOW FUNC SPEECH PATH, DG SWALLOW FUNC SPEECH PATH  Gastroesophageal reflux disease without esophagitis - Plan: DG SWALLOW FUNC SPEECH PATH, DG SWALLOW FUNC SPEECH PATH        Problem List Patient Active Problem List   Diagnosis Date Noted  . Abnormal Pap smear of cervix 04/30/2019  . Intractable nausea and vomiting 02/10/2018  . BMI 34.0-34.9,adult 06/07/2017   Dollene Primrose, MS/CCC- SLP  Leandrew Koyanagi 09/06/2019, 1:09 PM  [facility] [place] [facility] DIAGNOSTIC RADIOLOGY [address] [place], [place], [address] Phone: [phone]   Fax:     Name: Grace Griffin MRN: [id number] Date of Birth: [birth year]

## 2019-09-17 ENCOUNTER — Ambulatory Visit (INDEPENDENT_AMBULATORY_CARE_PROVIDER_SITE_OTHER): Payer: Medicaid Other | Admitting: Obstetrics & Gynecology

## 2019-09-17 ENCOUNTER — Other Ambulatory Visit: Payer: Self-pay

## 2019-09-17 ENCOUNTER — Other Ambulatory Visit (HOSPITAL_COMMUNITY)
Admission: RE | Admit: 2019-09-17 | Discharge: 2019-09-17 | Disposition: A | Discharge status: Home / Self Care | Payer: Medicaid Other | Source: Ambulatory Visit | Attending: Obstetrics & Gynecology | Admitting: Obstetrics & Gynecology

## 2019-09-17 ENCOUNTER — Encounter: Payer: Self-pay | Admitting: Obstetrics & Gynecology

## 2019-09-17 VITALS — BP 100/60 | Ht 63.0 in | Wt 180.0 lb

## 2019-09-17 DIAGNOSIS — Z30431 Encounter for routine checking of intrauterine contraceptive device: Secondary | ICD-10-CM | POA: Diagnosis not present

## 2019-09-17 DIAGNOSIS — N870 Mild cervical dysplasia: Secondary | ICD-10-CM | POA: Diagnosis not present

## 2019-09-17 NOTE — Progress Notes (Signed)
-   History of Present Illness:  Grace Griffin is a 24 y.o. that had a Mirena IUD placed approximately 18 months ago. Since that time, she states that she has had 6 months of more frequent and irregular bleeding, also headaches.  Likes this form of birth control otherwise.  Depo: weight gain; OCP: missed pills.   Also has h/o LGSIL by PAP in nov; prior PAP also was LGSIL w Colpo/Bx showing CIN I.  Prior STD testing in nov normal.  PMHx: She  has a past medical history of No known health problems and Post-operative nausea and vomiting (02/10/2018). Also,  has a past surgical history that includes Tonsillectomy; Wisdom tooth extraction; and Cesarean section (N/A, 02/04/2018)., family history includes Breast cancer in her paternal grandmother; Diabetes in her father.,  reports that she has never smoked. She has never used smokeless tobacco. She reports that she does not drink alcohol or use drugs. Current Meds  Medication Sig  . albuterol (PROVENTIL HFA;VENTOLIN HFA) 108 (90 Base) MCG/ACT inhaler Inhale 2 puffs into the lungs every 6 (six) hours as needed for wheezing or shortness of breath.  . levonorgestrel (MIRENA) 20 MCG/24HR IUD 1 each by Intrauterine route once.  .  Also, is allergic to penicillins..  Review of Systems  Constitutional: Negative for chills, fever and malaise/fatigue.  HENT: Negative for congestion, sinus pain and sore throat.   Eyes: Negative for blurred vision and pain.  Respiratory: Negative for cough and wheezing.   Cardiovascular: Negative for chest pain and leg swelling.  Gastrointestinal: Negative for abdominal pain, constipation, diarrhea, heartburn, nausea and vomiting.  Genitourinary: Negative for dysuria, frequency, hematuria and urgency.  Musculoskeletal: Negative for back pain, joint pain, myalgias and neck pain.  Skin: Negative for itching and rash.  Neurological: Negative for dizziness, tremors and weakness.  Endo/Heme/Allergies: Does not bruise/bleed easily.   Psychiatric/Behavioral: Negative for depression. The patient is not nervous/anxious and does not have insomnia.     Physical Exam:  BP 100/60   Ht 5\' 3"  (1.6 m)   Wt 180 lb (81.6 kg)   BMI 31.89 kg/m  Body mass index is 31.89 kg/m. Constitutional: Well nourished, well developed female in no acute distress.  Abdomen: diffusely non tender to palpation, non distended, and no masses, hernias Neuro: Grossly intact Psych:  Normal mood and affect.    Pelvic exam:  Two IUD strings present seen coming from the cervical os. EGBUS, vaginal vault and cervix: within normal limits  Assessment: IUD strings present in proper location; pt having frequent irreg menses   ICD-10-CM   1. Encounter for routine checking of intrauterine contraceptive device (IUD)  Z30.431 PELVIC COMPLETE WITH TRANSVAGINAL  2. CIN I (cervical intraepithelial neoplasia I)  N87.0 Cytology - PAP     Plan:US IUD check PAP Monitor headaches    See PCP for more investigation    Should not be related to IUD Consider alternative contraception    Due to BTB Frequent yeast infection    Consider Probiotic for prevention   A total of 20 minutes were spent face-to-face with the patient as well as preparation, review, communication, and documentation during this encounter.   [place], MD, [facility], [facility] 09/17/2019  2:54 PM

## 2019-09-17 NOTE — Patient Instructions (Signed)
Transvaginal Ultrasound for IUD check A transvaginal ultrasound, also called an endovaginal ultrasound, is a test that uses sound waves to take pictures of the female genital tract. The pictures are taken with a device, called a transducer, that is placed in the vagina. This test may be done to:  Check for problems with your pregnancy.  Check your developing baby.  Check for anything abnormal in the uterus or ovaries.  Find out why you have pelvic pain or bleeding. Tell a health care provider about:  Any allergies you have.  All medicines you are taking, including vitamins, herbs, eye drops, creams, and over-the-counter medicines.  Any blood disorders you have.  Any surgeries you have had.  Any medical conditions you have.  Whether you are pregnant or may be pregnant.  Whether you are having your menstrual period. What are the risks? This is a safe procedure. There are no known risks or complications of having this test. What happens before the procedure? This procedure needs to be done when your bladder is empty. Follow your health care provider's instructions about drinking fluids and emptying your bladder before the test. What happens during the procedure?   You will empty your bladder before the procedure.  You will undress from the waist down.  You will lie down on an exam table, with your knees bent and your feet in foot holders.  A health care provider will cover the transducer with a sterile cover.  A gel will be put on the transducer. The gel helps transmit the sound waves and prevents irritation of your vagina.  The technician will insert the transducer into your vagina to get images. These will be displayed on a monitor that looks like a small television screen.  The transducer will be removed when the procedure is complete. The procedure may vary among health care providers and hospitals. What happens after the procedure?  It is up to you to get the  results of your procedure. Ask your health care provider, or the department that is doing the procedure, when your results will be ready.  Keep all follow-up visits as told by your health care provider. This is important. Summary  A transvaginal ultrasound, also called an endovaginal ultrasound, is a test that uses sound waves to take pictures of the female genital tract.  This is a safe procedure. There are no known risks associated with this test.  The procedure needs to be done when your bladder is empty. Follow your health care provider's instructions about drinking fluids and emptying your bladder before the test.  During the procedure, you will undress from the waist down and lie down on an exam table. A technician will insert a transducer into your vagina to obtain images.  Ask your health care provider, or the department that is doing the procedure, when your results will be ready. This information is not intended to replace advice given to you by your health care provider. Make sure you discuss any questions you have with your health care provider. Document Revised: 12/28/2017 Document Reviewed: 12/28/2017 Elsevier Patient Education  2020 [employer].

## 2019-09-20 LAB — CYTOLOGY - PAP: Diagnosis: NEGATIVE

## 2019-10-01 ENCOUNTER — Encounter: Payer: Self-pay | Admitting: Obstetrics & Gynecology

## 2019-10-01 ENCOUNTER — Ambulatory Visit (INDEPENDENT_AMBULATORY_CARE_PROVIDER_SITE_OTHER): Payer: Medicaid Other | Admitting: Obstetrics & Gynecology

## 2019-10-01 ENCOUNTER — Ambulatory Visit (INDEPENDENT_AMBULATORY_CARE_PROVIDER_SITE_OTHER): Payer: Medicaid Other

## 2019-10-01 ENCOUNTER — Other Ambulatory Visit: Payer: Self-pay

## 2019-10-01 ENCOUNTER — Other Ambulatory Visit: Payer: Self-pay | Admitting: Obstetrics & Gynecology

## 2019-10-01 VITALS — BP 120/80 | Ht 63.0 in | Wt 180.0 lb

## 2019-10-01 DIAGNOSIS — T8332XA Displacement of intrauterine contraceptive device, initial encounter: Secondary | ICD-10-CM

## 2019-10-01 DIAGNOSIS — N921 Excessive and frequent menstruation with irregular cycle: Secondary | ICD-10-CM | POA: Diagnosis not present

## 2019-10-01 DIAGNOSIS — Z30431 Encounter for routine checking of intrauterine contraceptive device: Secondary | ICD-10-CM

## 2019-10-01 DIAGNOSIS — Z30433 Encounter for removal and reinsertion of intrauterine contraceptive device: Secondary | ICD-10-CM

## 2019-10-01 DIAGNOSIS — Z975 Presence of (intrauterine) contraceptive device: Secondary | ICD-10-CM

## 2019-10-01 NOTE — Progress Notes (Signed)
HPI: Pt has been having irreg freq bleeding.  Mirena for 18 mos (placed after CS).  Ultrasound demonstrates no masses seen, IUD in LUS  PMHx: She  has a past medical history of No known health problems and Post-operative nausea and vomiting (02/10/2018). Also,  has a past surgical history that includes Tonsillectomy; Wisdom tooth extraction; and Cesarean section (N/A, 02/04/2018)., family history includes Breast cancer in her paternal grandmother; Diabetes in her father.,  reports that she has never smoked. She has never used smokeless tobacco. She reports that she does not drink alcohol or use drugs.  She has a current medication list which includes the following prescription(s): albuterol and levonorgestrel. Also, is allergic to penicillins.  Review of Systems  All other systems reviewed and are negative.   Objective: BP 120/80   Ht 5\' 3"  (1.6 m)   Wt 180 lb (81.6 kg)   BMI 31.89 kg/m   Physical examination Constitutional NAD, Conversant  Skin No rashes, lesions or ulceration.   Extremities: Moves all appropriately.  Normal ROM for age. No lymphadenopathy.  Neuro: Grossly intact  Psych: Oriented to PPT.  Normal mood. Normal affect.    Pelvic exam:  Two IUD strings present seen coming from the cervical os. EGBUS, vaginal vault and cervix: within normal limits    PELVIS TRANSVAGINAL NON-OB (TV ONLY)  Result Date: 10/01/2019 Patient Name: Grace Griffin DOB: 05/06/1996 MRN: 1996 ULTRASOUND REPORT Location: [facility] Date of Service: 10/01/2019 Indications:Abnormal Uterine Bleeding with IUD Findings: The uterus is retroflexed and measures 7.8 x 4.5 x 3.9 cm. Echo texture is homogenous without evidence of focal masses. The Endometrium measures 11.2 mm. The IUD is incorrectly placed to low in the lower uterine segment/cervix. Right Ovary measures 3.7 x 3.3 x 2.2 cm. It is normal in appearance. Left Ovary measures 4.7 x 2.5 x 3.0 cm. It is normal in appearance. There is a  corpus luteum in the left ovary. Survey of the adnexa demonstrates no adnexal masses. There is no free fluid in the cul de sac. Impression: 1. Normal uterus and ovaries. 2. The IUD is incorrectly placed in the lower uterine segment/cervix. Recommendations: 1.Clinical correlation with the patient's History and Physical Exam. Grace, RT Review of ULTRASOUND.    I have personally reviewed images and report of recent ultrasound done at [facility].    Plan of management to be discussed with patient. [facility], MD, FACOG [facility], [facility] 10/01/2019  3:16 PM   Assessment:  Menometrorrhagia Breakthrough bleeding with IUD Encounter for removal and reinsertion of intrauterine contraceptive device  IUD Removal Strings of IUD identified and grasped.  IUD removed without problem.  Pt tolerated this well.  IUD noted to be intact.   IUD PROCEDURE NOTE:  Grace Griffin is a 24 y.o. G1P1001 here for IUD insertion as the current Mirena appears out of alignment by 21 today.   IUD Insertion Procedure Note Patient identified, informed consent performed, consent signed.   Discussed risks of irregular bleeding, cramping, infection, malpositioning or misplacement of the IUD outside the uterus which may require further procedure such as laparoscopy, risk of failure <1%. Time out was performed.  Urine pregnancy test negative.  A bimanual exam showed the uterus to be midposition.  Speculum placed in the vagina.  Cervix visualized.  Cleaned with Betadine x 2.  Grasped anteriorly with a single tooth tenaculum.  Uterus sounded to 7 cm.   Mirena IUD placed per manufacturer's recommendations.  Strings  trimmed to 3 cm. Tenaculum was removed, good hemostasis noted.  Patient tolerated procedure well.   Patient was given post-procedure instructions.  She was advised to have backup contraception for one week.  Patient was also asked to check IUD strings periodically and follow up in 4 weeks for  IUD check (w [place] due to this past orientation concern).  Barnett Applebaum, MD, [facility], [facility] 10/01/2019  3:25 PM

## 2019-10-05 ENCOUNTER — Ambulatory Visit (INDEPENDENT_AMBULATORY_CARE_PROVIDER_SITE_OTHER): Payer: Self-pay

## 2019-10-05 ENCOUNTER — Other Ambulatory Visit: Payer: Self-pay

## 2019-10-05 ENCOUNTER — Other Ambulatory Visit: Payer: Self-pay | Admitting: Obstetrics & Gynecology

## 2019-10-05 DIAGNOSIS — R3915 Urgency of urination: Secondary | ICD-10-CM

## 2019-10-05 LAB — POCT URINALYSIS DIPSTICK
Appearance: ABNORMAL
Bilirubin, UA: NEGATIVE
Glucose, UA: NEGATIVE
Ketones, UA: NEGATIVE
Nitrite, UA: POSITIVE
Odor: ABNORMAL
Protein, UA: POSITIVE — AB
Spec Grav, UA: 1.02 (ref 1.010–1.025)
Urobilinogen, UA: 0.2 E.U./dL
pH, UA: 6 (ref 5.0–8.0)

## 2019-10-05 MED ORDER — SULFAMETHOXAZOLE-TRIMETHOPRIM 800-160 MG PO TABS: 1.0000 | ORAL_TABLET | Freq: Two times a day (BID) | ORAL | 0 refills | Status: AC

## 2019-10-05 NOTE — Telephone Encounter (Signed)
Please advise 

## 2019-10-05 NOTE — Progress Notes (Signed)
Patient presents with urge to urinate without much output. UA performed results documented in Chart. (Small Leuks, Nit +, Large Blood). Culture drawn.

## 2019-10-05 NOTE — Progress Notes (Signed)
RPH informed patient via my chart of treatment and no culture at this time.

## 2019-10-05 NOTE — Progress Notes (Signed)
Do not send culture Let her know will call in antibiotic for UTI

## 2019-10-31 ENCOUNTER — Ambulatory Visit (INDEPENDENT_AMBULATORY_CARE_PROVIDER_SITE_OTHER): Payer: Medicaid Other

## 2019-10-31 ENCOUNTER — Encounter: Payer: Self-pay | Admitting: Obstetrics & Gynecology

## 2019-10-31 ENCOUNTER — Other Ambulatory Visit: Payer: Self-pay | Admitting: Obstetrics & Gynecology

## 2019-10-31 ENCOUNTER — Ambulatory Visit (INDEPENDENT_AMBULATORY_CARE_PROVIDER_SITE_OTHER): Payer: Medicaid Other | Admitting: Obstetrics & Gynecology

## 2019-10-31 ENCOUNTER — Other Ambulatory Visit: Payer: Self-pay

## 2019-10-31 VITALS — BP 120/80 | Ht 63.0 in | Wt 172.0 lb

## 2019-10-31 DIAGNOSIS — Z975 Presence of (intrauterine) contraceptive device: Secondary | ICD-10-CM

## 2019-10-31 DIAGNOSIS — Z30431 Encounter for routine checking of intrauterine contraceptive device: Secondary | ICD-10-CM

## 2019-10-31 DIAGNOSIS — N921 Excessive and frequent menstruation with irregular cycle: Secondary | ICD-10-CM

## 2019-10-31 NOTE — Progress Notes (Signed)
  History of Present Illness:  Grace Griffin is a 24 y.o. that had a Mirena IUD placed approximately 1 month ago. Since that time, she states that she has had no bleeding and pain  PMHx: She  has a past medical history of No known health problems and Post-operative nausea and vomiting (02/10/2018). Also,  has a past surgical history that includes Tonsillectomy; Wisdom tooth extraction; and Cesarean section (N/A, 02/04/2018)., family history includes Breast cancer in her paternal grandmother; Diabetes in her father.,  reports that she has never smoked. She has never used smokeless tobacco. She reports that she does not drink alcohol or use drugs. No outpatient medications have been marked as taking for the 10/31/19 encounter (Office Visit) with Nadara Mustard, MD.  .  Also, is allergic to penicillins..  Review of Systems  All other systems reviewed and are negative.   Physical Exam:  BP 120/80   Ht 5\' 3"  (1.6 m)   Wt 172 lb (78 kg)   BMI 30.47 kg/m  Body mass index is 30.47 kg/m. Constitutional: Well nourished, well developed female in no acute distress.  Abdomen: diffusely non tender to palpation, non distended, and no masses, hernias Neuro: Grossly intact Psych:  Normal mood and affect.    Pelvic exam:  Two IUD strings present seen coming from the cervical os. EGBUS, vaginal vault and cervix: within normal limits  also done:    IUD in correct orientation and position    otherwise normal [place]  Assessment: IUD strings present in proper location; pt doing well  Plan: She was told to continue to use barrier contraception, in order to prevent any STIs, and to take a home pregnancy test or call us if she ever thinks she may be pregnant, and that her IUD expires in 6 years.  She was amenable to this plan and we will see her back in 5 mos for PAP/PRN.  A total of 20 minutes were spent face-to-face with the patient as well as preparation, review, communication, and documentation  during this encounter.   [place], MD, Annamarie Major Ob/Gyn, [facility] 10/31/2019  4:23 PM

## 2019-11-05 ENCOUNTER — Other Ambulatory Visit: Payer: Self-pay | Admitting: Obstetrics & Gynecology

## 2019-11-05 ENCOUNTER — Encounter: Payer: Self-pay | Admitting: Obstetrics & Gynecology

## 2019-11-05 ENCOUNTER — Other Ambulatory Visit: Payer: Self-pay

## 2019-11-05 ENCOUNTER — Ambulatory Visit (INDEPENDENT_AMBULATORY_CARE_PROVIDER_SITE_OTHER): Payer: Medicaid Other | Admitting: Obstetrics & Gynecology

## 2019-11-05 VITALS — BP 120/80 | Ht 63.0 in | Wt 172.0 lb

## 2019-11-05 DIAGNOSIS — B9689 Other specified bacterial agents as the cause of diseases classified elsewhere: Secondary | ICD-10-CM

## 2019-11-05 DIAGNOSIS — N760 Acute vaginitis: Secondary | ICD-10-CM

## 2019-11-05 MED ORDER — METRONIDAZOLE 500 MG PO TABS: 500.0000 mg | ORAL_TABLET | Freq: Two times a day (BID) | ORAL | 0 refills | Status: DC

## 2019-11-05 MED ORDER — METRONIDAZOLE 0.75 % VA GEL
1.0000 | Freq: Every day | VAGINAL | 0 refills | Status: AC
Start: 2019-11-05 — End: 2019-11-10

## 2019-11-05 NOTE — Telephone Encounter (Signed)
Attempt to reach patient via phone. Voicemail back is full unable to leave message

## 2019-11-05 NOTE — Progress Notes (Signed)
HPI:      Grace Griffin is a 24 y.o. G1P1001 who LMP was No LMP recorded. (Menstrual status: IUD)., presents today for a problem visit.  She complains of:  Vaginitis: Patient complains of an abnormal vaginal discharge for 2 days. Vaginal symptoms include burning and discharge described as white.Vulvar symptoms include none.STI Risk: Very low risk of STD exposureDischarge described as: white.Other associated symptoms: none.Menstrual pattern: She had been bleeding none w IUD. Contraception: IUD  PMHx: She  has a past medical history of No known health problems and Post-operative nausea and vomiting (02/10/2018). Also,  has a past surgical history that includes Tonsillectomy; Wisdom tooth extraction; and Cesarean section (N/A, 02/04/2018)., family history includes Breast cancer in her paternal grandmother; Diabetes in her father.,  reports that she has never smoked. She has never used smokeless tobacco. She reports that she does not drink alcohol or use drugs.  She has a current medication list which includes the following prescription(s): albuterol, levonorgestrel, and metronidazole. Also, is allergic to penicillins.  Review of Systems  Constitutional: Negative for chills, fever and malaise/fatigue.  HENT: Negative for congestion, sinus pain and sore throat.   Eyes: Negative for blurred vision and pain.  Respiratory: Negative for cough and wheezing.   Cardiovascular: Negative for chest pain and leg swelling.  Gastrointestinal: Negative for abdominal pain, constipation, diarrhea, heartburn, nausea and vomiting.  Genitourinary: Negative for dysuria, frequency, hematuria and urgency.  Musculoskeletal: Negative for back pain, joint pain, myalgias and neck pain.  Skin: Negative for itching and rash.  Neurological: Negative for dizziness, tremors and weakness.  Endo/Heme/Allergies: Does not bruise/bleed easily.  Psychiatric/Behavioral: Negative for depression. The patient is not nervous/anxious  and does not have insomnia.     Objective: BP 120/80   Ht 5\' 3"  (1.6 m)   Wt 172 lb (78 kg)   BMI 30.47 kg/m  Physical Exam Constitutional:      General: She is not in acute distress.    Appearance: She is well-developed.  Genitourinary:     Pelvic exam was performed with patient supine.     Vagina and uterus normal.     No vaginal erythema or bleeding.     No cervical motion tenderness, discharge, polyp or nabothian cyst.     IUD strings visualized.     Uterus is mobile.     Uterus is not enlarged.     No uterine mass detected.    Uterus is midaxial.     No right or left adnexal mass present.     Right adnexa not tender.     Left adnexa not tender.     Genitourinary Comments: Scanty discharge  HENT:     Head: Normocephalic and atraumatic.     Nose: Nose normal.  Abdominal:     General: There is no distension.     Palpations: Abdomen is soft.     Tenderness: There is no abdominal tenderness.  Musculoskeletal:        General: Normal range of motion.  Neurological:     Mental Status: She is alert and oriented to person, place, and time.     Cranial Nerves: No cranial nerve deficit.  Skin:    General: Skin is warm and dry.  Psychiatric:        Attention and Perception: Attention normal.        Mood and Affect: Mood and affect normal.        Speech: Speech normal.  Behavior: Behavior normal.        Thought Content: Thought content normal.        Judgment: Judgment normal.   Microscopic wet-mount exam shows clue cells.   ASSESSMENT/PLAN:    Problem List Items Addressed This Visit    None    Visit Diagnoses    Bacterial vaginosis    -  Primary   Relevant Medications   metroNIDAZOLE (FLAGYL) 500 MG tablet    Treat and see how she does    She will decide between Metrogel and Flagyl based on price, prefers no pills if possible (she is self pay) Cont IUD  Barnett Applebaum, MD, [facility], [facility] 11/05/2019  4:40 PM

## 2019-11-05 NOTE — Patient Instructions (Signed)

## 2019-11-05 NOTE — Telephone Encounter (Signed)
Can you work in

## 2019-11-05 NOTE — Telephone Encounter (Signed)
Work in

## 2019-11-16 ENCOUNTER — Telehealth: Payer: Self-pay | Admitting: Advanced Practice Midwife

## 2019-11-16 ENCOUNTER — Other Ambulatory Visit: Payer: Self-pay | Admitting: Advanced Practice Midwife

## 2019-11-16 DIAGNOSIS — N898 Other specified noninflammatory disorders of vagina: Secondary | ICD-10-CM

## 2019-11-16 MED ORDER — FLUCONAZOLE 150 MG PO TABS: 150.0000 mg | ORAL_TABLET | Freq: Once | ORAL | 1 refills | Status: AC

## 2019-11-16 MED ORDER — METRONIDAZOLE 0.75 % VA GEL: 1.0000 | Freq: Every day | VAGINAL | 1 refills | Status: AC

## 2019-11-16 MED ORDER — TERCONAZOLE 0.8 % VA CREA: 1.0000 | TOPICAL_CREAM | Freq: Every day | VAGINAL | 0 refills | Status: DC

## 2019-11-16 NOTE — Telephone Encounter (Signed)
I spoke with Grace Griffin. We figured out a plan!

## 2019-11-16 NOTE — Progress Notes (Unsigned)
Rx terconazole and metro gel sent for patient for recurrent vaginitis symptoms. Recommend she return for aptima swab if symptoms persist.  Update: Spoke with patient. She prefers Diflucan to Terconazole. Rx changed. Wet prep at previous visit was positive for clue cells. We discussed basic preventive measures for vaginitis.

## 2019-11-16 NOTE — Telephone Encounter (Signed)
Patient missed a call, thinks maybe Grace Griffin was giving her a call regarding Rx.  She did say the cream doesn't work for her and asked if pill form could be sent to pharmacy.  Same cb.

## 2020-03-20 ENCOUNTER — Ambulatory Visit: Payer: Medicaid Other | Admitting: Obstetrics & Gynecology

## 2020-04-17 NOTE — Progress Notes (Unsigned)
Patient is due for yearly PAP 03/2020.  She had current PAP thru [facility] on 09/17/2019 by Dr. Tiburcio Pea, which was Normal (see note) and he wants to repeat in 1 year (08/2020).  She last had her well woman annual visit at [facility] with [facility] 02/2020. Hart Carwin, RN

## 2020-04-30 ENCOUNTER — Other Ambulatory Visit: Payer: Self-pay

## 2020-04-30 ENCOUNTER — Ambulatory Visit
Admission: EM | Admit: 2020-04-30 | Discharge: 2020-04-30 | Disposition: A | Discharge status: Home / Self Care | Payer: Managed Care, Other (non HMO)

## 2020-04-30 NOTE — ED Triage Notes (Signed)
Pt c/o right lower abdominal pain that radiates across to the left side. Started about 3 days ago. She states she has no appetite and nausea.

## 2020-10-03 ENCOUNTER — Telehealth: Payer: Self-pay | Admitting: Nurse Practitioner

## 2020-10-03 NOTE — Telephone Encounter (Signed)
Telephone call to patient regarding PAP follow up.  No answer, left a message to call. Glenna Fellows, RN

## 2020-11-10 ENCOUNTER — Other Ambulatory Visit: Payer: Self-pay | Admitting: Infectious Diseases

## 2020-11-10 ENCOUNTER — Other Ambulatory Visit (HOSPITAL_COMMUNITY): Payer: Self-pay | Admitting: Infectious Diseases

## 2020-11-10 DIAGNOSIS — R109 Unspecified abdominal pain: Secondary | ICD-10-CM

## 2020-11-11 ENCOUNTER — Ambulatory Visit
Admission: RE | Admit: 2020-11-11 | Discharge: 2020-11-11 | Disposition: A | Discharge status: Home / Self Care | Payer: Managed Care, Other (non HMO) | Source: Ambulatory Visit | Attending: Infectious Diseases | Admitting: Infectious Diseases

## 2020-11-11 ENCOUNTER — Other Ambulatory Visit: Payer: Self-pay

## 2020-11-11 DIAGNOSIS — R109 Unspecified abdominal pain: Secondary | ICD-10-CM | POA: Diagnosis not present

## 2020-12-02 ENCOUNTER — Telehealth: Payer: Self-pay | Admitting: Nurse Practitioner

## 2020-12-02 NOTE — Telephone Encounter (Signed)
Telephone call to patient regarding PAP follow up.  No answer, unable to leave a message. Certified letter sent to residence listed.  Glenna Fellows, RN

## 2021-01-02 ENCOUNTER — Other Ambulatory Visit: Payer: Self-pay | Admitting: Gastroenterology

## 2021-01-02 DIAGNOSIS — R1084 Generalized abdominal pain: Secondary | ICD-10-CM

## 2021-01-14 ENCOUNTER — Ambulatory Visit: Admission: RE | Admit: 2021-01-14 | Payer: Managed Care, Other (non HMO) | Source: Ambulatory Visit

## 2021-02-04 ENCOUNTER — Other Ambulatory Visit
Admission: RE | Admit: 2021-02-04 | Discharge: 2021-02-04 | Disposition: A | Discharge status: Home / Self Care | Payer: Managed Care, Other (non HMO) | Source: Ambulatory Visit | Attending: Infectious Diseases | Admitting: Infectious Diseases

## 2021-02-04 DIAGNOSIS — R5081 Fever presenting with conditions classified elsewhere: Secondary | ICD-10-CM | POA: Insufficient documentation

## 2021-02-04 DIAGNOSIS — L089 Local infection of the skin and subcutaneous tissue, unspecified: Secondary | ICD-10-CM | POA: Insufficient documentation

## 2021-02-04 LAB — D-DIMER, QUANTITATIVE: D-Dimer, Quant: <0.27 ug/mL-FEU (ref 0.00–0.50)

## 2021-06-07 ENCOUNTER — Other Ambulatory Visit: Payer: Self-pay

## 2021-06-07 ENCOUNTER — Emergency Department
Admission: EM | Admit: 2021-06-07 | Discharge: 2021-06-07 | Disposition: A | Discharge status: Home / Self Care | Payer: Managed Care, Other (non HMO) | Attending: Student in an Organized Health Care Education/Training Program | Admitting: Student in an Organized Health Care Education/Training Program

## 2021-06-07 ENCOUNTER — Encounter: Payer: Self-pay | Admitting: Emergency Medicine

## 2021-06-07 ENCOUNTER — Emergency Department: Payer: Managed Care, Other (non HMO)

## 2021-06-07 DIAGNOSIS — K5120 Ulcerative (chronic) proctitis without complications: Secondary | ICD-10-CM | POA: Insufficient documentation

## 2021-06-07 DIAGNOSIS — K6289 Other specified diseases of anus and rectum: Secondary | ICD-10-CM

## 2021-06-07 DIAGNOSIS — R1033 Periumbilical pain: Secondary | ICD-10-CM | POA: Diagnosis present

## 2021-06-07 LAB — URINALYSIS, ROUTINE W REFLEX MICROSCOPIC
Bilirubin Urine: NEGATIVE
Glucose, UA: NEGATIVE mg/dL
Hgb urine dipstick: NEGATIVE
Ketones, ur: NEGATIVE mg/dL
Leukocytes,Ua: NEGATIVE
Nitrite: NEGATIVE
Protein, ur: NEGATIVE mg/dL
Specific Gravity, Urine: 1.018 (ref 1.005–1.030)
pH: 7 (ref 5.0–8.0)

## 2021-06-07 LAB — PREGNANCY, URINE: Preg Test, Ur: NEGATIVE

## 2021-06-07 LAB — COMPREHENSIVE METABOLIC PANEL
ALT: 22 U/L (ref 0–44)
AST: 24 U/L (ref 15–41)
Albumin: 4.8 g/dL (ref 3.5–5.0)
Alkaline Phosphatase: 37 U/L — ABNORMAL LOW (ref 38–126)
Anion gap: 8 (ref 5–15)
BUN: 12 mg/dL (ref 6–20)
CO2: 26 mmol/L (ref 22–32)
Calcium: 9.4 mg/dL (ref 8.9–10.3)
Chloride: 105 mmol/L (ref 98–111)
Creatinine, Ser: 0.98 mg/dL (ref 0.44–1.00)
GFR, Estimated: >60 mL/min (ref >60)
Glucose, Bld: 98 mg/dL (ref 70–99)
Potassium: 3.9 mmol/L (ref 3.5–5.1)
Sodium: 139 mmol/L (ref 135–145)
Total Bilirubin: 1.6 mg/dL — ABNORMAL HIGH (ref 0.3–1.2)
Total Protein: 7.3 g/dL (ref 6.5–8.1)

## 2021-06-07 LAB — CBC
HCT: 41.5 % (ref 36.0–46.0)
Hemoglobin: 14.3 g/dL (ref 12.0–15.0)
MCH: 30.4 pg (ref 26.0–34.0)
MCHC: 34.5 g/dL (ref 30.0–36.0)
MCV: 88.1 fL (ref 80.0–100.0)
Platelets: 237 10*3/uL (ref 150–400)
RBC: 4.71 MIL/uL (ref 3.87–5.11)
RDW: 12 % (ref 11.5–15.5)
WBC: 6.2 10*3/uL (ref 4.0–10.5)
nRBC: 0 % (ref 0.0–0.2)

## 2021-06-07 LAB — LIPASE, BLOOD: Lipase: 49 U/L (ref 11–51)

## 2021-06-07 MED ORDER — IOHEXOL 300 MG/ML  SOLN
80.0000 mL | Freq: Once | INTRAMUSCULAR | Status: AC | PRN
  Administered 2021-06-07: 80 mL via INTRAVENOUS
  Filled 2021-06-07: qty 80

## 2021-06-07 MED ORDER — FLUCONAZOLE 150 MG PO TABS
150.0000 mg | ORAL_TABLET | Freq: Once | ORAL | 0 refills | Status: AC
Start: 2021-06-07 — End: 2021-06-07

## 2021-06-07 MED ORDER — DICYCLOMINE HCL 10 MG/5ML PO SOLN: 10.0000 mg | Freq: Three times a day (TID) | ORAL | 0 refills | Status: DC | PRN

## 2021-06-07 MED ORDER — METRONIDAZOLE 50 MG/ML ORAL SUSPENSION: 500.0000 mg | Freq: Two times a day (BID) | ORAL | 0 refills | Status: AC

## 2021-06-07 MED ORDER — CIPROFLOXACIN 500 MG/5ML (10%) PO SUSR: 500.0000 mg | Freq: Two times a day (BID) | ORAL | 0 refills | Status: AC

## 2021-06-07 NOTE — ED Triage Notes (Signed)
Pt via POV from home. Pt c/o RLQ pain that happened suddenly while she was working out. States the pain has eased off but still there. Also states that she has a hx of ovarian cyst. Denies any NVD at this time. Pt is A&Ox4 and NAD.

## 2021-06-07 NOTE — ED Provider Notes (Signed)
[facility] Provider Note  Patient Contact: 6:23 PM (approximate)   History   Abdominal Pain   HPI  Grace Griffin is a 26 y.o. female who presents to the emergency department complaining of abdominal pain.  Patient was at the gym, had sudden onset of sharp abdominal pain.  Patient use the restroom, felt like it did improve somewhat with bowel movement but did not completely resolved.  She still experiencing pain.  Initially it was periumbilical, now has traveled to the left side.  Patient states that she does have a history of cysts on her ovaries, they have been bilateral but the last check that she had they were improving/shrinking in size.  Patient has no hematuria, dysuria, polyuria.  No flank pain.  Patient has had no nausea, vomiting, diarrhea or constipation.  Still has her appendix and gallbladder.     Physical Exam   Triage Vital Signs: ED Triage Vitals [06/07/21 1603]  Enc Vitals Group     BP 122/80     Pulse Rate 76     Resp 18     Temp 98.1 F (36.7 C)     Temp Source Oral     SpO2 100 %     Weight 120 lb (54.4 kg)     Height 5\' 3"  (1.6 m)     Head Circumference      Peak Flow      Pain Score 7     Pain Loc      Pain Edu?      Excl. in GC?     Most recent vital signs: Vitals:   06/07/21 1603  BP: 122/80  Pulse: 76  Resp: 18  Temp: 98.1 F (36.7 C)  SpO2: 100%     General: Alert and in no acute distress. Cardiovascular:  Good peripheral perfusion Respiratory: Normal respiratory effort without tachypnea or retractions. Lungs CTAB.  Gastrointestinal: Bowel sounds 4 quadrants.  Soft to palpation all quadrants.  Patient is tender in the left lower quadrant at this time.  No guarding or rigidity. No palpable masses. No distention. No CVA tenderness. Musculoskeletal: Full range of motion to all extremities.  Neurologic:  No gross focal neurologic deficits are appreciated.  Skin:   No rash noted Other:   ED Results /  Procedures / Treatments   Labs (all labs ordered are listed, but only abnormal results are displayed) Labs Reviewed  COMPREHENSIVE METABOLIC PANEL - Abnormal; Notable for the following components:      Result Value   Alkaline Phosphatase 37 (*)    Total Bilirubin 1.6 (*)    All other components within normal limits  URINALYSIS, ROUTINE W REFLEX MICROSCOPIC - Abnormal; Notable for the following components:   Color, Urine YELLOW (*)    APPearance CLEAR (*)    All other components within normal limits  LIPASE, BLOOD  CBC  PREGNANCY, URINE  POC URINE PREG, ED     EKG     RADIOLOGY  I personally viewed and evaluated these images as part of my medical decision making, as well as reviewing the written report by the radiologist.  ED Provider Interpretation: Visualization of CT scan reveals findings consistent with colitis/proctitis.  This matches patient's pain complaints.  No other significant findings at this time on CT.  CT ABDOMEN PELVIS W CONTRAST  Result Date: 06/07/2021 CLINICAL DATA:  Left lower quadrant abdominal pain. EXAM: CT ABDOMEN AND PELVIS WITH CONTRAST TECHNIQUE: Multidetector CT imaging of the abdomen and  pelvis was performed using the standard protocol following bolus administration of intravenous contrast. CONTRAST:  80mL OMNIPAQUE IOHEXOL 300 MG/ML  SOLN COMPARISON:  CT 02/10/2018 FINDINGS: Lower chest: No focal airspace disease or pleural effusion. The heart is normal in size. Hepatobiliary: No focal liver abnormality is seen. No gallstones, gallbladder wall thickening, or biliary dilatation. Pancreas: Unremarkable. No pancreatic ductal dilatation or surrounding inflammatory changes. Spleen: Normal in size without focal abnormality. Adrenals/Urinary Tract: Normal adrenal glands. No hydronephrosis or perinephric edema. Homogeneous renal enhancement. Punctate nonobstructing stone in the upper left kidney. Urinary bladder is physiologically distended without wall  thickening. Stomach/Bowel: Fluid cyst ingested material in the stomach. No abnormal gastric distension. There is no small bowel obstruction or inflammatory change. Cecum is low-lying in the deep pelvis. Appendix is not confidently visualized. Small volume of stool in the right colon. Left colon is completely decompressed. Suggestion of mild hyperemia about the distal sigmoid colon and rectum, for example series 2, image 75, mild adjacent fat stranding. Vascular/Lymphatic: Normal caliber abdominal aorta. Patent portal vein. No portal venous or mesenteric gas. No abdominopelvic adenopathy. Reproductive: IUD appropriately position in the uterus, the uterus is anteverted. Unremarkable CT appearance of the ovaries. No adnexal mass. Other: Small amount of simple free fluid in the dependent pelvis. No free air or focal fluid collection. No abdominal wall hernia. Musculoskeletal: There are no acute or suspicious osseous abnormalities. Slight scoliotic curvature with hemi transitional lumbosacral anatomy. IMPRESSION: 1. Suggestion of mild hyperemia about the distal sigmoid colon and rectum with mild adjacent fat stranding, suspicious for mild colitis/proctitis. 2. Punctate nonobstructing stone in the upper left kidney. Electronically Signed   By: Keith Rake M.D.   On: 06/07/2021 19:55    PROCEDURES:  Critical Care performed: No  Procedures   MEDICATIONS ORDERED IN ED: Medications  iohexol (OMNIPAQUE) 300 MG/ML solution 80 mL (80 mLs Intravenous Contrast Given 06/07/21 1934)     IMPRESSION / MDM / ASSESSMENT AND PLAN / ED COURSE  I reviewed the triage vital signs and the nursing notes.                              Differential diagnosis includes, but is not limited to, colitis, diverticulitis, ovarian torsion, ovarian cyst, UTI   Patient's diagnosis is consistent with proctitis.  Patient presented to the emergency department with left lower quadrant abdominal pain.  Began rather rapidly today.  No  fevers or chills, emesis, diarrhea or constipation.  Tender in the left lower quadrant.  No history of diverticulosis or diverticulitis.  She does have a history of bilateral ovarian cysts.  Patient with no vaginal discharge, bleeding, urinary changes.  Review of patient's medical records reveals that she has had multiple visits with her OB/GYN for pelvic pain.  She has had multiple pelvic ultrasounds without significant findings.  Given the change in pattern, as well as multiple reassuring ultrasounds I felt CT was warranted.  This was ordered which revealed findings consistent with proctitis..  Labs are otherwise largely reassuring to include CBC and CMP.  Urinalysis reveals no evidence of UTI.  Lipase is reassuring.  This time patient be treated for proctitis with Cipro and Flagyl.  She cannot take pills and oral suspension will be ordered.  Diflucan prescribed in case patient does develop yeast infection symptoms.  Patient will also have Bentyl for symptom relief of pain/cramping in the left lower quadrant.  Follow-up with GI.  Return precautions discussed with  the patient.  Patient is given ED precautions to return to the ED for any worsening or new symptoms.         FINAL CLINICAL IMPRESSION(S) / ED DIAGNOSES   Final diagnoses:  Proctitis     Rx / DC Orders   ED Discharge Orders          Ordered    ciprofloxacin (CIPRO) 500 MG/5ML (10%) suspension  2 times daily        06/07/21 2023    metroNIDAZOLE (FLAGYL) 50 mg/ml oral suspension  2 times daily        06/07/21 2023    fluconazole (DIFLUCAN) 150 MG tablet   Once        06/07/21 2023    dicyclomine (BENTYL) 10 MG/5ML solution  3 times daily PRN        06/07/21 2023             Note:  This document was prepared using Dragon voice recognition software and may include unintentional dictation errors.   Brynda Peon 06/07/21 2050    Merlyn Lot, MD 06/07/21 2213

## 2022-04-25 ENCOUNTER — Emergency Department
Admission: EM | Admit: 2022-04-25 | Discharge: 2022-04-25 | Disposition: A | Discharge status: Home / Self Care | Payer: Managed Care, Other (non HMO) | Attending: Emergency Medicine | Admitting: Emergency Medicine

## 2022-04-25 ENCOUNTER — Emergency Department: Payer: Managed Care, Other (non HMO)

## 2022-04-25 ENCOUNTER — Other Ambulatory Visit: Payer: Self-pay

## 2022-04-25 DIAGNOSIS — R1032 Left lower quadrant pain: Secondary | ICD-10-CM | POA: Insufficient documentation

## 2022-04-25 DIAGNOSIS — R110 Nausea: Secondary | ICD-10-CM | POA: Diagnosis not present

## 2022-04-25 DIAGNOSIS — R1033 Periumbilical pain: Secondary | ICD-10-CM | POA: Diagnosis present

## 2022-04-25 LAB — URINALYSIS, ROUTINE W REFLEX MICROSCOPIC
Bacteria, UA: NONE SEEN
Bilirubin Urine: NEGATIVE
Glucose, UA: NEGATIVE mg/dL
Ketones, ur: 5 mg/dL — AB
Nitrite: NEGATIVE
Protein, ur: 30 mg/dL — AB
Specific Gravity, Urine: 1.026 (ref 1.005–1.030)
pH: 5 (ref 5.0–8.0)

## 2022-04-25 LAB — CBC
HCT: 47.3 % — ABNORMAL HIGH (ref 36.0–46.0)
Hemoglobin: 16.2 g/dL — ABNORMAL HIGH (ref 12.0–15.0)
MCH: 29.4 pg (ref 26.0–34.0)
MCHC: 34.2 g/dL (ref 30.0–36.0)
MCV: 85.8 fL (ref 80.0–100.0)
Platelets: 246 10*3/uL (ref 150–400)
RBC: 5.51 MIL/uL — ABNORMAL HIGH (ref 3.87–5.11)
RDW: 12 % (ref 11.5–15.5)
WBC: 10.9 10*3/uL — ABNORMAL HIGH (ref 4.0–10.5)
nRBC: 0 % (ref 0.0–0.2)

## 2022-04-25 LAB — COMPREHENSIVE METABOLIC PANEL
ALT: 23 U/L (ref 0–44)
AST: 19 U/L (ref 15–41)
Albumin: 4.7 g/dL (ref 3.5–5.0)
Alkaline Phosphatase: 47 U/L (ref 38–126)
Anion gap: 13 (ref 5–15)
BUN: 14 mg/dL (ref 6–20)
CO2: 21 mmol/L — ABNORMAL LOW (ref 22–32)
Calcium: 9.1 mg/dL (ref 8.9–10.3)
Chloride: 105 mmol/L (ref 98–111)
Creatinine, Ser: 0.76 mg/dL (ref 0.44–1.00)
GFR, Estimated: >60 mL/min (ref >60)
Glucose, Bld: 118 mg/dL — ABNORMAL HIGH (ref 70–99)
Potassium: 3.8 mmol/L (ref 3.5–5.1)
Sodium: 139 mmol/L (ref 135–145)
Total Bilirubin: 1.9 mg/dL — ABNORMAL HIGH (ref 0.3–1.2)
Total Protein: 7.4 g/dL (ref 6.5–8.1)

## 2022-04-25 LAB — LIPASE, BLOOD: Lipase: 41 U/L (ref 11–51)

## 2022-04-25 LAB — POC URINE PREG, ED: Preg Test, Ur: NEGATIVE

## 2022-04-25 MED ORDER — IOHEXOL 350 MG/ML SOLN
75.0000 mL | Freq: Once | INTRAVENOUS | Status: AC | PRN
  Administered 2022-04-25: 75 mL via INTRAVENOUS

## 2022-04-25 MED ORDER — ONDANSETRON 4 MG PO TBDP
4.0000 mg | ORAL_TABLET | Freq: Once | ORAL | Status: AC
  Administered 2022-04-25: 4 mg via ORAL
  Filled 2022-04-25: qty 1

## 2022-04-25 MED ORDER — SODIUM CHLORIDE 0.9 % IV BOLUS
1000.0000 mL | Freq: Once | INTRAVENOUS | Status: AC
  Administered 2022-04-25: 1000 mL via INTRAVENOUS

## 2022-04-25 MED ORDER — ONDANSETRON 4 MG PO TBDP: 4.0000 mg | ORAL_TABLET | Freq: Three times a day (TID) | ORAL | 0 refills | Status: DC | PRN

## 2022-04-25 NOTE — ED Triage Notes (Signed)
Pt states yesterday she started having umbilical pain but didn't think much of it but last night the pain got worse, she was nauseous- pt states she never threw up but when she knelt by the toilet she got sweaty, dizzy, and felt like she was going to pass out- pt states she ran a fever last night- pt states that she has not had anything to eat since last night when this started and has only had a few sips of water since

## 2022-04-25 NOTE — Discharge Instructions (Addendum)
Follow-up with your primary care provider if any continued problems or concerns.  A prescription for Zofran was sent to the pharmacy to take as needed for nausea.  Today stay on clear liquids which includes Sprite, 7-Up, ginger ale, Gatorade, popsicles.  Use Zofran as needed.  If there is no nausea tomorrow been slowly advance your diet to toast, crackers, applesauce and advance slowly as tolerated.  Return to the emergency department for any worsening of your symptoms.

## 2022-04-25 NOTE — ED Provider Notes (Signed)
[facility] Provider Note    Event Date/Time   First MD Initiated Contact with Patient 04/25/22 1250     (approximate)   History   Abdominal Pain   HPI  Grace Griffin is a 26 y.o. female   presents to the ED with complaint of umbilical pain that started yesterday and then during the night got worse.  Patient states that she got nauseous but never threw up.  She states that she knelt over the toilet and got sweaty and dizzy but never felt like she was going to pass out.  She states that she did have a fever last evening but did not actually take it.  She has not eaten anything since last night and only a few sips of water this morning due to nausea.  Patient has a history of irretractable nausea and vomiting and menometrorrhagia.      Physical Exam   Triage Vital Signs: ED Triage Vitals  Enc Vitals Group     BP 04/25/22 1217 114/78     Pulse Rate 04/25/22 1218 (!) 123     Resp 04/25/22 1217 18     Temp 04/25/22 1217 98.3 F (36.8 C)     Temp Source 04/25/22 1217 Oral     SpO2 04/25/22 1217 100 %     Weight 04/25/22 1217 130 lb (59 kg)     Height 04/25/22 1217 5\' 3"  (1.6 m)     Head Circumference --      Peak Flow --      Pain Score 04/25/22 1217 5     Pain Loc --      Pain Edu? --      Excl. in GC? --     Most recent vital signs: Vitals:   04/25/22 1217 04/25/22 1218  BP: 114/78   Pulse:  (!) 123  Resp: 18   Temp: 98.3 F (36.8 C)   SpO2: 100%      General: Awake, no distress.  CV:  Good peripheral perfusion.  Heart regular rate and rhythm. Resp:  Normal effort.  Lungs are clear bilaterally. Abd:  No distention.  Soft, minimal diffuse tenderness noted to the left lateral abdominal area.  No point tenderness is noted.  No rebound tenderness or referred pain.  Bowel sounds are normoactive x 4 quadrants. Other:     ED Results / Procedures / Treatments   Labs (all labs ordered are listed, but only abnormal results are  displayed) Labs Reviewed  COMPREHENSIVE METABOLIC PANEL - Abnormal; Notable for the following components:      Result Value   CO2 21 (*)    Glucose, Bld 118 (*)    Total Bilirubin 1.9 (*)    All other components within normal limits  CBC - Abnormal; Notable for the following components:   WBC 10.9 (*)    RBC 5.51 (*)    Hemoglobin 16.2 (*)    HCT 47.3 (*)    All other components within normal limits  URINALYSIS, ROUTINE W REFLEX MICROSCOPIC - Abnormal; Notable for the following components:   Color, Urine YELLOW (*)    APPearance CLOUDY (*)    Hgb urine dipstick MODERATE (*)    Ketones, ur 5 (*)    Protein, ur 30 (*)    Leukocytes,Ua MODERATE (*)    All other components within normal limits  LIPASE, BLOOD  POC URINE PREG, ED     EKG Vent. rate 115 BPM PR interval 130 ms  QRS duration 82 ms QT/QTcB 308/426 ms P-R-T axes 77 112 -25 Sinus tachycardia Right atrial enlargement Right axis deviation ST & T wave abnormality, consider inferior ischemia ST & T wave abnormality, consider anterolateral ischemia Abnormal ECG When compared with ECG of 28-May-2018 14:04, QRS axis Shifted right Non-specific change in ST segment in Inferior leads   RADIOLOGY CT abdomen pelvis with contrast per radiologist is negative.    PROCEDURES:  Critical Care performed:   Procedures   MEDICATIONS ORDERED IN ED: Medications  ondansetron (ZOFRAN-ODT) disintegrating tablet 4 mg (4 mg Oral Given 04/25/22 1334)  sodium chloride 0.9 % bolus 1,000 mL (1,000 mLs Intravenous New Bag/Given 04/25/22 1359)  iohexol (OMNIPAQUE) 350 MG/ML injection 75 mL (75 mLs Intravenous Contrast Given 04/25/22 1519)     IMPRESSION / MDM / ASSESSMENT AND PLAN / ED COURSE  I reviewed the triage vital signs and the nursing notes.   Differential diagnosis includes, but is not limited to, viral gastroenteritis, urinary tract infection, urolithiasis, diverticulitis, ovarian cyst.  26 year old female presents  to the ED with complaint abdominal pain that started last evening and patient had nausea without vomiting or diarrhea.  Patient was given Zofran while in the ED lung with fluids and improved.  CT scan of the abdomen and pelvis was negative for any acute intra-abdominal findings to explain patient's pain.  No vomiting was noted while patient was in the emergency department.  She was made aware of the results of her CT scan.  A prescription for Zofran was sent to the pharmacy with instructions also to stay on clear liquids the remainder of today.  Is to follow-up with her PCP if any continued problems or return to the emergency department if she develops any severe worsening of her symptoms.      Patient's presentation is most consistent with acute complicated illness / injury requiring diagnostic workup.  FINAL CLINICAL IMPRESSION(S) / ED DIAGNOSES   Final diagnoses:  Left lower quadrant abdominal pain  Nausea     Rx / DC Orders   ED Discharge Orders          Ordered    ondansetron (ZOFRAN-ODT) 4 MG disintegrating tablet  Every 8 hours PRN        04/25/22 1621             Note:  This document was prepared using Dragon voice recognition software and may include unintentional dictation errors.   Tommi Rumps, PA-C 04/25/22 1624    Sharman Cheek, MD 04/25/22 [phone]

## 2023-11-02 DIAGNOSIS — Z8759 Personal history of other complications of pregnancy, childbirth and the puerperium: Secondary | ICD-10-CM | POA: Insufficient documentation

## 2023-11-02 DIAGNOSIS — O0990 Supervision of high risk pregnancy, unspecified, unspecified trimester: Secondary | ICD-10-CM | POA: Insufficient documentation

## 2023-11-02 DIAGNOSIS — O34219 Maternal care for unspecified type scar from previous cesarean delivery: Secondary | ICD-10-CM | POA: Insufficient documentation

## 2023-11-02 DIAGNOSIS — Z8659 Personal history of other mental and behavioral disorders: Secondary | ICD-10-CM | POA: Insufficient documentation

## 2023-11-02 LAB — OB RESULTS CONSOLE HEPATITIS B SURFACE ANTIGEN: Hepatitis B Surface Ag: NEGATIVE

## 2023-11-02 LAB — OB RESULTS CONSOLE VARICELLA ZOSTER ANTIBODY, IGG: Varicella: IMMUNE

## 2023-11-02 LAB — OB RESULTS CONSOLE RUBELLA ANTIBODY, IGM: Rubella: IMMUNE

## 2024-02-29 LAB — OB RESULTS CONSOLE HIV ANTIBODY (ROUTINE TESTING): HIV: NONREACTIVE

## 2024-03-05 ENCOUNTER — Encounter: Payer: Self-pay | Admitting: Obstetrics and Gynecology

## 2024-03-05 ENCOUNTER — Observation Stay
Admission: EM | Admit: 2024-03-05 | Discharge: 2024-03-05 | Disposition: A | Discharge status: Home / Self Care | Attending: Obstetrics and Gynecology | Admitting: Obstetrics and Gynecology

## 2024-03-05 ENCOUNTER — Other Ambulatory Visit: Payer: Self-pay

## 2024-03-05 DIAGNOSIS — O132 Gestational [pregnancy-induced] hypertension without significant proteinuria, second trimester: Principal | ICD-10-CM | POA: Insufficient documentation

## 2024-03-05 DIAGNOSIS — Z3A28 28 weeks gestation of pregnancy: Secondary | ICD-10-CM | POA: Insufficient documentation

## 2024-03-05 DIAGNOSIS — O163 Unspecified maternal hypertension, third trimester: Principal | ICD-10-CM | POA: Diagnosis present

## 2024-03-05 LAB — COMPREHENSIVE METABOLIC PANEL WITH GFR
ALT: 14 U/L (ref 0–44)
AST: 19 U/L (ref 15–41)
Albumin: 3.2 g/dL — ABNORMAL LOW (ref 3.5–5.0)
Alkaline Phosphatase: 47 U/L (ref 38–126)
Anion gap: 10 (ref 5–15)
BUN: 8 mg/dL (ref 6–20)
CO2: 21 mmol/L — ABNORMAL LOW (ref 22–32)
Calcium: 8.7 mg/dL — ABNORMAL LOW (ref 8.9–10.3)
Chloride: 107 mmol/L (ref 98–111)
Creatinine, Ser: 0.43 mg/dL — ABNORMAL LOW (ref 0.44–1.00)
GFR, Estimated: >60 mL/min
Glucose, Bld: 91 mg/dL (ref 70–99)
Potassium: 3.6 mmol/L (ref 3.5–5.1)
Sodium: 138 mmol/L (ref 135–145)
Total Bilirubin: 0.2 mg/dL (ref 0.0–1.2)
Total Protein: 6.3 g/dL — ABNORMAL LOW (ref 6.5–8.1)

## 2024-03-05 LAB — CBC
HCT: 33.6 % — ABNORMAL LOW (ref 36.0–46.0)
Hemoglobin: 11.8 g/dL — ABNORMAL LOW (ref 12.0–15.0)
MCH: 29.9 pg (ref 26.0–34.0)
MCHC: 35.1 g/dL (ref 30.0–36.0)
MCV: 85.1 fL (ref 80.0–100.0)
Platelets: 191 K/uL (ref 150–400)
RBC: 3.95 MIL/uL (ref 3.87–5.11)
RDW: 11.8 % (ref 11.5–15.5)
WBC: 12.4 K/uL — ABNORMAL HIGH (ref 4.0–10.5)
nRBC: 0 % (ref 0.0–0.2)

## 2024-03-05 LAB — PROTEIN / CREATININE RATIO, URINE
Creatinine, Urine: <10 mg/dL
Total Protein, Urine: <6 mg/dL

## 2024-03-05 NOTE — Discharge Summary (Signed)
 Patient ID: Grace Griffin MRN: [id number] DOB/AGE: 27/01/1996 27 y.o.  Admit date: 03/05/2024 Discharge date: 03/05/2024  Admission Diagnoses: 28yo G2P1 at [redacted]w[redacted]d present with elevated BPs at home.  Denies HA, RUQ pain, or vision changes.  Home BP was 144/80.  Discharge Diagnoses: Normotensive with negative PIH labs  Factors complicating pregnancy: Previous C/S x 1 H/o mental health diagnoses: anxiety Hx of miscarriage   Prenatal Procedures: none  Consults: None  Significant Diagnostic Studies:  Results for orders placed or performed during the hospital encounter of 03/05/24 (from the past week)  Protein / creatinine ratio, urine   Collection Time: 03/05/24  4:29 PM  Result Value Ref Range   Creatinine, Urine <10 mg/dL   Total Protein, Urine <6 mg/dL   Protein Creatinine Ratio        0.00 - 0.15 mg/mg[Cre]  CBC   Collection Time: 03/05/24  4:29 PM  Result Value Ref Range   WBC 12.4 (H) 4.0 - 10.5 K/uL   RBC 3.95 3.87 - 5.11 MIL/uL   Hemoglobin 11.8 (L) 12.0 - 15.0 g/dL   HCT 66.3 (L) 63.9 - 53.9 %   MCV 85.1 80.0 - 100.0 fL   MCH 29.9 26.0 - 34.0 pg   MCHC 35.1 30.0 - 36.0 g/dL   RDW 88.1 88.4 - 84.4 %   Platelets 191 150 - 400 K/uL   nRBC 0.0 0.0 - 0.2 %  Comprehensive metabolic panel   Collection Time: 03/05/24  4:29 PM  Result Value Ref Range   Sodium 138 135 - 145 mmol/L   Potassium 3.6 3.5 - 5.1 mmol/L   Chloride 107 98 - 111 mmol/L   CO2 21 (L) 22 - 32 mmol/L   Glucose, Bld 91 70 - 99 mg/dL   BUN 8 6 - 20 mg/dL   Creatinine, Ser 9.56 (L) 0.44 - 1.00 mg/dL   Calcium 8.7 (L) 8.9 - 10.3 mg/dL   Total Protein 6.3 (L) 6.5 - 8.1 g/dL   Albumin 3.2 (L) 3.5 - 5.0 g/dL   AST 19 15 - 41 U/L   ALT 14 0 - 44 U/L   Alkaline Phosphatase 47 38 - 126 U/L   Total Bilirubin 0.2 0.0 - 1.2 mg/dL   GFR, Estimated >39 >39 mL/min   Anion gap 10 5 - 15    Treatments: none  Hospital Course:  This is a 28 y.o. G2P1001 with IUP at [redacted]w[redacted]d admitted for elevated home BPs.    Vitals:   03/05/24 1545 03/05/24 1605 03/05/24 1615 03/05/24 1630  BP: 116/61 115/60 (!) 112/57 110/62   03/05/24 1645 03/05/24 1700 03/05/24 1715 03/05/24 1730  BP: (!) 104/54 102/60 (!) 97/57 105/65    Lab results noted above and normal.  She was observed, fetal heart rate monitoring remained reassuring, and she had no signs/symptoms of  other maternal-fetal concerns.  She was deemed stable for discharge to home with outpatient follow up.  Discharge Physical Exam:  BP 105/65   Pulse 88   LMP 08/20/2023 (Exact Date)   NST: FHR baseline: 145 bpm Variability: moderate Accelerations: yes Decelerations: none Category/reactivity: reactive   TOCO: quiet SVE: deferred      Discharge Condition: Stable  Disposition: Discharge disposition: 01-Home or Self Care        Allergies as of 03/05/2024       Reactions   Penicillins    Has patient had a PCN reaction causing immediate rash, facial/tongue/throat swelling, SOB or lightheadedness with hypotension: Unknown Has patient had  a PCN reaction causing severe rash involving mucus membranes or skin necrosis: Unknown Has patient had a PCN reaction that required hospitalization: Unknown Has patient had a PCN reaction occurring within the last 10 years: Unknown If all of the above answers are NO, then may proceed with Cephalosporin use.        Medication List     STOP taking these medications    dicyclomine  10 MG/5ML solution Commonly known as: BENTYL    levonorgestrel  20 MCG/24HR IUD Commonly known as: MIRENA        TAKE these medications    albuterol 108 (90 Base) MCG/ACT inhaler Commonly known as: VENTOLIN HFA Inhale 2 puffs into the lungs every 6 (six) hours as needed for wheezing or shortness of breath.   ondansetron  4 MG disintegrating tablet Commonly known as: ZOFRAN -ODT Take 1 tablet (4 mg total) by mouth every 8 (eight) hours as needed for nausea or vomiting.         SignedBETHA DELON COE,  CNM 03/05/2024 5:53 PM

## 2024-03-05 NOTE — OB Triage Note (Signed)

## 2024-03-05 NOTE — OB Triage Note (Signed)
 Pt reports to labor and delivery with complaints of elevated blood pressures. She said she just feels off.' Denies headache, changes in vision, and abnormal swelling. Denies pain, vaginal bleeding, LOF, and contractions. States positive fetal movement.

## 2024-04-30 LAB — OB RESULTS CONSOLE GBS: GBS: NEGATIVE

## 2024-04-30 NOTE — H&P (Signed)
 OB History & Physical   History of Present Illness:  Chief Complaint: here for repeat c-section  HPI:  Grace Griffin is a 28 y.o. G48P1011 female at [redacted]w[redacted]d dated by LMP consistent with 8 week u/s.  Her pregnancy has been complicated by history of c-section.    She denies contractions (some recent BH ctx).   She denies leakage of fluid.   She denies vaginal bleeding.   She reports fetal movement.    Total weight gain for pregnancy: 22.3 kg (49 lb 3.2 oz)   Maternal Medical History:   Past Medical History:  Diagnosis Date   Anxiety    History of abnormal cervical Pap smear     Past Surgical History:  Procedure Laterality Date   CESAREAN SECTION  2019   COLONOSCOPY  08/20/2021   Normal colon biopsy/Repeat at age 15, in 58yrs/CTL   adenoidectomy     COLPOSCOPY     TONSILLECTOMY     tonsillectomy      No Known Allergies  Prior to Admission medications  Medication Sig Taking? Last Dose  prenatal vit-iron fum-folic ac (PRENAVITE) tablet Take 1 tablet by mouth once daily Yes Taking  ALPRAZolam (XANAX) 0.25 MG tablet Take 1 tablet (0.25 mg total) by mouth once daily as needed for Anxiety Patient not taking: Reported on 04/30/2024  Not Taking  triamcinolone 0.1 % ointment Apply topically 2 (two) times daily as needed (irritation) Patient not taking: Reported on 04/30/2024  Not Taking    OB History  Gravida Para Term Preterm AB Living  3 1 1  1 1   SAB IAB Ectopic Molar Multiple Live Births  1     1    # Outcome Date GA Lbr Len/2nd Weight Sex Type Anes PTL Lv  3 Current           2 Term 02/04/18 [redacted]w[redacted]d / 02:49 3.95 kg (8 lb 11.3 oz) M CS-LTranv EPI  LIV     Birth Comments: molding/caput/bruising to head; meconium stained skin; peeling  1 SAB             Prenatal care site: [facility]  Social History: She  reports that she has never smoked. She has never used smokeless tobacco. She reports that she does not currently use alcohol. She reports that she does not use  drugs.  Family History: family history includes Breast cancer in her paternal grandfather; Diabetes in her father.    Review of Systems  Constitutional: Negative.   HENT: Negative.    Eyes: Negative.   Respiratory: Negative.    Cardiovascular: Negative.   Gastrointestinal: Negative.   Genitourinary: Negative.   Musculoskeletal: Negative.   Skin: Negative.   Neurological: Negative.   Endo/Heme/Allergies: Negative.   Psychiatric/Behavioral: Negative.       Physical Exam:  BP 108/71   Pulse 88   Ht 160 cm (5' 3)   Wt 84 kg (185 lb 3.2 oz)   LMP 08/20/2023   BMI 32.81 kg/m   Physical Exam Constitutional:      General: She is not in acute distress.    Appearance: Normal appearance.  HENT:     Head: Normocephalic and atraumatic.  Eyes:     General: No scleral icterus.    Conjunctiva/sclera: Conjunctivae normal.  Cardiovascular:     Rate and Rhythm: Normal rate and regular rhythm.     Heart sounds: No murmur heard.    No friction rub. No gallop.  Pulmonary:     Effort: Pulmonary  effort is normal. No respiratory distress.     Breath sounds: Normal breath sounds. No wheezing, rhonchi or rales.  Abdominal:     General: Bowel sounds are normal. There is no distension.     Palpations: Abdomen is soft. There is no mass.     Tenderness: There is no abdominal tenderness. There is no guarding or rebound.  Musculoskeletal:        General: No swelling. Normal range of motion.  Neurological:     General: No focal deficit present.     Mental Status: She is oriented to person, place, and time.     Cranial Nerves: No cranial nerve deficit.  Skin:    General: Skin is warm and dry.     Findings: No lesion.  Psychiatric:        Mood and Affect: Mood normal.        Behavior: Behavior normal.        Judgment: Judgment normal.  Vitals and nursing note reviewed.      Pertinent Results:  Prenatal Labs Blood type/Rh O positive  Antibody screen negative  Rubella Immune   Varicella Immune    RPR NR (11/02/2023, 02/29/2024)  HBsAg negative  HIV negative (11/01/2023, 02/29/2024)  GC negative  Chlamydia negative  Genetic screening Diploid XX  1 hour GTT 80  3 hour GTT N/a  GBS unknownas of 04/30/2024   Assessment:  Grace Griffin is a 28 y.o. G45P1011 female at [redacted]w[redacted]d with history of c-section, desires repeat.   Plan:  Admit to Labor & Delivery  CBC, T&S, NPO, IVF GBS pending as of today.   Consents for surgery signed today. To OR on 05/21/2024    Jaison Petraglia DANIEL Audery Wassenaar, MD 04/30/2024 2:19 PM

## 2024-05-17 ENCOUNTER — Inpatient Hospital Stay
Admission: RE | Admit: 2024-05-17 | Discharge: 2024-05-17 | Attending: Obstetrics and Gynecology | Admitting: Obstetrics and Gynecology

## 2024-05-17 ENCOUNTER — Other Ambulatory Visit: Payer: Self-pay

## 2024-05-17 HISTORY — DX: Mild cervical dysplasia: N87.0

## 2024-05-17 HISTORY — DX: Gastro-esophageal reflux disease without esophagitis: K21.9

## 2024-05-17 HISTORY — DX: Anxiety disorder, unspecified: F41.9

## 2024-05-17 HISTORY — DX: Personal history of other complications of pregnancy, childbirth and the puerperium: Z87.59

## 2024-05-17 HISTORY — DX: History of uterine scar from previous surgery: Z98.891

## 2024-05-17 NOTE — Patient Instructions (Addendum)
 Your procedure is scheduled on:05-21-24 Monday  Arrival Time: Please call Labor and Delivery if you have any questions [phone].  Arrival time: 5:30 AM  Arrival: If your arrival time is prior to 6:00 am, please enter through the Emergency Room Entrance and you will be directed to Labor and Delivery. If your arrival time is 6:00 am or later, please enter the Medical Mall and follow the greeter's instructions.  REMEMBER: Instructions that are not followed completely may result in serious medical risk, up to and including death; or upon the discretion of your surgeon and anesthesiologist your surgery may need to be rescheduled.  Do not eat food OR drink liquids after midnight the night before surgery.  No gum chewing or hard candies.  One week prior to surgery:Stop NOW (05-17-24) Stop Anti-inflammatories (NSAIDS) such as Advil , Aleve, Ibuprofen , Motrin , Naproxen, Naprosyn and Aspirin based products such as Excedrin, Goody's Powder, BC Powder.  You may however, continue to take Tylenol  if needed for pain up until the day of surgery  Do NOT take any medication the day of surgery  No Alcohol for 24 hours before or after surgery.  No Smoking including e-cigarettes for 24 hours prior to surgery.  No chewable tobacco products for at least 6 hours prior to surgery.  No nicotine patches on the day of surgery.  Do not use any recreational drugs for at least a week prior to your surgery.  Please be advised that the combination of cocaine and anesthesia may have negative outcomes, up to and including death. If you test positive for cocaine, your surgery will be cancelled.  On the morning of surgery brush your teeth with toothpaste and water, you may rinse your mouth with mouthwash if you wish. Do not swallow any toothpaste or mouthwash.  Use CHG soap as directed on instruction sheet (Avoid Nipple and Private Areas)  Do not wear jewelry, make-up, hairpins, clips or nail polish.  For  welded (permanent) jewelry: bracelets, anklets, waist bands, etc.  Please have this removed prior to surgery.  If it is not removed, there is a chance that hospital personnel will need to cut it off on the day of surgery.  Do not wear lotions, powders, or perfumes.   Do not shave body hair from the neck down 48 hours before surgery.  Contact lenses, hearing aids and dentures may not be worn into surgery.  Do not bring valuables to the hospital. [facility] is not responsible for any missing/lost belongings or valuables.   Notify your doctor if there is any change in your medical condition (cold, fever, infection).  Wear comfortable clothing (specific to your surgery type) to the hospital.  After surgery, you can help prevent lung complications by doing breathing exercises.  Take deep breaths and cough every 1-2 hours. Your doctor may order a device called an Incentive Spirometer to help you take deep breaths. When coughing or sneezing, hold a pillow firmly against your incision with both hands. This is called splinting. Doing this helps protect your incision. It also decreases belly discomfort.  Please call the Pre-admissions Testing Dept. at [phone] if you have any questions about these instructions.  Surgery Visitation Policy:  Visitor Passes   All visitors, including children, need an identification sticker when visiting. These stickers must be worn where they can be seen.   Labor & Delivery  Laboring women may have one designated support person and two other visitors of any age visit. The support person must remain the same.  The visitors may switch with other visitors. Visitation is permitted 24 hours per day. The designated support person or a visitor over the age of 16 may sleep overnight in the patient's room. A doula registered with [facility] for labor and delivery support is not considered a visitor. Doulas not registered with [facility] are considered  visitors.  Mother Baby Unit, OB Specialty and Gynecological Care  A designated support person and three visitors of any age may visit. The three visitors may switch out. The designated support person or a visitor age 4 or older may stay overnight in the room. During the postpartum period (up to 6 weeks), if the mother is the patient, she can have her newborn stay with her if there is another support person present who can be responsible for the baby.                                                                                                             Preparing for Surgery with CHLORHEXIDINE GLUCONATE (CHG) Soap  Chlorhexidine Gluconate (CHG) Soap  o An antiseptic cleaner that kills germs and bonds with the skin to continue killing germs even after washing  o Used for showering the night before surgery and morning of surgery  Before surgery, you can play an important role by reducing the number of germs on your skin.  CHG (Chlorhexidine gluconate) soap is an antiseptic cleanser which kills germs and bonds with the skin to continue killing germs even after washing.  Please do not use if you have an allergy to CHG or antibacterial soaps. If your skin becomes reddened/irritated stop using the CHG.  1. Shower the NIGHT BEFORE SURGERY with CHG soap.  2. If you choose to wash your hair, wash your hair first as usual with your normal shampoo.  3. After shampooing, rinse your hair and body thoroughly to remove the shampoo.  4. Use CHG as you would any other liquid soap. You can apply CHG directly to the skin and wash gently with a clean washcloth.  5. Apply the CHG soap to your body only from the neck down. Do not use on open wounds or open sores. Avoid contact with your eyes, ears, mouth, and genitals (private parts). Wash face and genitals (private parts) with your normal soap.  6. Wash thoroughly, paying special attention to the area where your surgery will be performed.  7.  Thoroughly rinse your body with warm water.  8. Do not shower/wash with your normal soap after using and rinsing off the CHG soap.  9. Do not use lotions, oils, etc., after showering with CHG.  10. Pat yourself dry with a clean towel.  11. Wear clean pajamas to bed the night before surgery.  12. Place clean sheets on your bed the night of your shower and do not sleep with pets.  13. Do not apply any deodorants/lotions/powders.  14. Please wear clean clothes to the hospital.  15. Remember to brush your teeth with your regular toothpaste.

## 2024-05-18 ENCOUNTER — Encounter
Admission: RE | Admit: 2024-05-18 | Discharge: 2024-05-18 | Disposition: A | Discharge status: Home / Self Care | Source: Ambulatory Visit | Attending: Obstetrics and Gynecology | Admitting: Obstetrics and Gynecology

## 2024-05-18 DIAGNOSIS — Z3A00 Weeks of gestation of pregnancy not specified: Secondary | ICD-10-CM | POA: Insufficient documentation

## 2024-05-18 DIAGNOSIS — O34219 Maternal care for unspecified type scar from previous cesarean delivery: Secondary | ICD-10-CM | POA: Insufficient documentation

## 2024-05-18 DIAGNOSIS — Z01812 Encounter for preprocedural laboratory examination: Secondary | ICD-10-CM | POA: Insufficient documentation

## 2024-05-18 LAB — CBC
HCT: 38 % (ref 36.0–46.0)
Hemoglobin: 12.3 g/dL (ref 12.0–15.0)
MCH: 26.5 pg (ref 26.0–34.0)
MCHC: 32.4 g/dL (ref 30.0–36.0)
MCV: 81.7 fL (ref 80.0–100.0)
Platelets: 217 K/uL (ref 150–400)
RBC: 4.65 MIL/uL (ref 3.87–5.11)
RDW: 13.1 % (ref 11.5–15.5)
WBC: 10 K/uL (ref 4.0–10.5)
nRBC: 0 % (ref 0.0–0.2)

## 2024-05-18 LAB — TYPE AND SCREEN
ABO/RH(D): O POS
Antibody Screen: NEGATIVE
Extend sample reason: UNDETERMINED

## 2024-05-19 LAB — SYPHILIS: RPR W/REFLEX TO RPR TITER AND TREPONEMAL ANTIBODIES, TRADITIONAL SCREENING AND DIAGNOSIS ALGORITHM: RPR Ser Ql: NONREACTIVE

## 2024-05-20 MED ORDER — LACTATED RINGERS IV SOLN: Freq: Once | INTRAVENOUS | Status: AC

## 2024-05-20 MED ORDER — BUPIVACAINE 0.25 % ON-Q PUMP DUAL CATH 400 ML
400.0000 mL | INJECTION | Status: DC
  Filled 2024-05-20: qty 400

## 2024-05-20 MED ORDER — BUPIVACAINE HCL (PF) 0.5 % IJ SOLN
5.0000 mL | Freq: Once | INTRAMUSCULAR | Status: DC
  Filled 2024-05-20: qty 10

## 2024-05-20 MED ORDER — CHLORHEXIDINE GLUCONATE 0.12 % MT SOLN
15.0000 mL | Freq: Once | OROMUCOSAL | Status: AC
  Administered 2024-05-21: 15 mL via OROMUCOSAL
  Filled 2024-05-20: qty 15

## 2024-05-20 MED ORDER — ORAL CARE MOUTH RINSE: 15.0000 mL | Freq: Once | OROMUCOSAL | Status: AC

## 2024-05-20 MED ORDER — CEFAZOLIN SODIUM-DEXTROSE 2-4 GM/100ML-% IV SOLN
2.0000 g | INTRAVENOUS | Status: AC
  Administered 2024-05-21: 2 g via INTRAVENOUS
  Filled 2024-05-20: qty 100

## 2024-05-21 ENCOUNTER — Inpatient Hospital Stay
Admission: RE | Admit: 2024-05-21 | Discharge: 2024-05-23 | DRG: 788 | Disposition: A | Discharge status: Home / Self Care | Attending: Obstetrics and Gynecology | Admitting: Obstetrics and Gynecology

## 2024-05-21 ENCOUNTER — Encounter
Admission: RE | Disposition: A | Discharge status: Home / Self Care | Payer: Self-pay | Source: Home / Self Care | Attending: Obstetrics and Gynecology

## 2024-05-21 ENCOUNTER — Encounter: Payer: Self-pay | Admitting: Urgent Care

## 2024-05-21 ENCOUNTER — Encounter: Payer: Self-pay | Admitting: Obstetrics and Gynecology

## 2024-05-21 ENCOUNTER — Other Ambulatory Visit: Payer: Self-pay

## 2024-05-21 ENCOUNTER — Inpatient Hospital Stay: Admitting: Anesthesiology

## 2024-05-21 DIAGNOSIS — O34211 Maternal care for low transverse scar from previous cesarean delivery: Secondary | ICD-10-CM | POA: Diagnosis present

## 2024-05-21 DIAGNOSIS — Z833 Family history of diabetes mellitus: Secondary | ICD-10-CM | POA: Diagnosis not present

## 2024-05-21 DIAGNOSIS — Z98891 History of uterine scar from previous surgery: Secondary | ICD-10-CM

## 2024-05-21 DIAGNOSIS — O0990 Supervision of high risk pregnancy, unspecified, unspecified trimester: Secondary | ICD-10-CM

## 2024-05-21 DIAGNOSIS — Z3A39 39 weeks gestation of pregnancy: Secondary | ICD-10-CM

## 2024-05-21 DIAGNOSIS — O9962 Diseases of the digestive system complicating childbirth: Secondary | ICD-10-CM | POA: Diagnosis present

## 2024-05-21 DIAGNOSIS — K219 Gastro-esophageal reflux disease without esophagitis: Secondary | ICD-10-CM | POA: Diagnosis present

## 2024-05-21 DIAGNOSIS — Z3490 Encounter for supervision of normal pregnancy, unspecified, unspecified trimester: Secondary | ICD-10-CM | POA: Insufficient documentation

## 2024-05-21 DIAGNOSIS — O34219 Maternal care for unspecified type scar from previous cesarean delivery: Principal | ICD-10-CM

## 2024-05-21 SURGERY — Surgical Case
Anesthesia: Spinal | Site: Abdomen

## 2024-05-21 MED ORDER — MEPERIDINE HCL 25 MG/ML IJ SOLN: 6.2500 mg | INTRAMUSCULAR | Status: DC | PRN

## 2024-05-21 MED ORDER — BUPIVACAINE IN DEXTROSE 0.75-8.25 % IT SOLN
INTRATHECAL | Status: DC | PRN
  Administered 2024-05-21: 1.4 mL via INTRATHECAL
  Administered 2024-05-21: 1 mL via INTRATHECAL

## 2024-05-21 MED ORDER — MORPHINE SULFATE (PF) 0.5 MG/ML IJ SOLN
INTRAMUSCULAR | Status: DC | PRN
  Administered 2024-05-21: .1 mg via INTRATHECAL

## 2024-05-21 MED ORDER — COCONUT OIL OIL: 1.0000 | TOPICAL_OIL | Status: DC | PRN

## 2024-05-21 MED ORDER — FENTANYL CITRATE (PF) 100 MCG/2ML IJ SOLN
INTRAMUSCULAR | Status: AC
  Filled 2024-05-21: qty 2

## 2024-05-21 MED ORDER — MENTHOL 3 MG MT LOZG: 1.0000 | LOZENGE | OROMUCOSAL | Status: DC | PRN

## 2024-05-21 MED ORDER — OXYTOCIN-SODIUM CHLORIDE 30-0.9 UT/500ML-% IV SOLN
INTRAVENOUS | Status: DC | PRN
  Administered 2024-05-21: 600 mL/h via INTRAVENOUS

## 2024-05-21 MED ORDER — PHENYLEPHRINE 80 MCG/ML (10ML) SYRINGE FOR IV PUSH (FOR BLOOD PRESSURE SUPPORT)
PREFILLED_SYRINGE | INTRAVENOUS | Status: DC | PRN
  Administered 2024-05-21: 160 ug via INTRAVENOUS
  Administered 2024-05-21: 80 ug via INTRAVENOUS

## 2024-05-21 MED ORDER — KETOROLAC TROMETHAMINE 15 MG/ML IJ SOLN: 15.0000 mg | Freq: Four times a day (QID) | INTRAMUSCULAR | Status: DC

## 2024-05-21 MED ORDER — IBUPROFEN 100 MG/5ML PO SUSP
600.0000 mg | Freq: Four times a day (QID) | ORAL | Status: DC
  Administered 2024-05-21 – 2024-05-23 (×6): 600 mg via ORAL
  Filled 2024-05-21 (×9): qty 30

## 2024-05-21 MED ORDER — FERROUS SULFATE 325 (65 FE) MG PO TABS: 325.0000 mg | ORAL_TABLET | Freq: Two times a day (BID) | ORAL | Status: DC

## 2024-05-21 MED ORDER — DIBUCAINE (PERIANAL) 1 % EX OINT: 1.0000 | TOPICAL_OINTMENT | CUTANEOUS | Status: DC | PRN

## 2024-05-21 MED ORDER — DIPHENHYDRAMINE HCL 25 MG PO CAPS: 25.0000 mg | ORAL_CAPSULE | ORAL | Status: DC | PRN

## 2024-05-21 MED ORDER — SCOPOLAMINE 1 MG/3DAYS TD PT72
1.0000 | MEDICATED_PATCH | Freq: Once | TRANSDERMAL | Status: DC
  Filled 2024-05-21: qty 1

## 2024-05-21 MED ORDER — SODIUM CHLORIDE 0.9% FLUSH: 3.0000 mL | INTRAVENOUS | Status: DC | PRN

## 2024-05-21 MED ORDER — FENTANYL CITRATE (PF) 100 MCG/2ML IJ SOLN
INTRAMUSCULAR | Status: DC | PRN
  Administered 2024-05-21: 20 ug via INTRATHECAL
  Administered 2024-05-21: 10 ug via INTRATHECAL

## 2024-05-21 MED ORDER — ONDANSETRON HCL 4 MG/2ML IJ SOLN
4.0000 mg | Freq: Three times a day (TID) | INTRAMUSCULAR | Status: DC | PRN
  Filled 2024-05-21: qty 2

## 2024-05-21 MED ORDER — SOD CITRATE-CITRIC ACID 500-334 MG/5ML PO SOLN
30.0000 mL | ORAL | Status: AC
  Administered 2024-05-21: 30 mL via ORAL

## 2024-05-21 MED ORDER — SIMETHICONE 80 MG PO CHEW
80.0000 mg | CHEWABLE_TABLET | Freq: Three times a day (TID) | ORAL | Status: DC
  Administered 2024-05-21 – 2024-05-23 (×7): 80 mg via ORAL
  Filled 2024-05-21 (×7): qty 1

## 2024-05-21 MED ORDER — FERROUS SULFATE 220 (44 FE) MG/5ML PO SOLN
220.0000 mg | Freq: Two times a day (BID) | ORAL | Status: DC
  Administered 2024-05-21 – 2024-05-23 (×4): 220 mg via ORAL
  Filled 2024-05-21 (×7): qty 5

## 2024-05-21 MED ORDER — BUPIVACAINE HCL (PF) 0.5 % IJ SOLN
INTRAMUSCULAR | Status: AC
  Filled 2024-05-21: qty 20

## 2024-05-21 MED ORDER — PHENYLEPHRINE 80 MCG/ML (10ML) SYRINGE FOR IV PUSH (FOR BLOOD PRESSURE SUPPORT)
PREFILLED_SYRINGE | INTRAVENOUS | Status: AC
  Filled 2024-05-21: qty 10

## 2024-05-21 MED ORDER — KETOROLAC TROMETHAMINE 30 MG/ML IJ SOLN
INTRAMUSCULAR | Status: AC
  Filled 2024-05-21: qty 1

## 2024-05-21 MED ORDER — ONDANSETRON HCL 4 MG/2ML IJ SOLN
INTRAMUSCULAR | Status: AC
  Filled 2024-05-21: qty 2

## 2024-05-21 MED ORDER — OXYCODONE-ACETAMINOPHEN 5-325 MG PO TABS: 1.0000 | ORAL_TABLET | ORAL | Status: DC | PRN

## 2024-05-21 MED ORDER — BUPIVACAINE HCL (PF) 0.25 % IJ SOLN
INTRAMUSCULAR | Status: AC
  Filled 2024-05-21: qty 30

## 2024-05-21 MED ORDER — ACETAMINOPHEN 160 MG/5ML PO SOLN
1000.0000 mg | Freq: Four times a day (QID) | ORAL | Status: AC
  Administered 2024-05-21 – 2024-05-22 (×4): 1000 mg via ORAL
  Filled 2024-05-21 (×4): qty 40.6

## 2024-05-21 MED ORDER — PHENYLEPHRINE HCL-NACL 20-0.9 MG/250ML-% IV SOLN
INTRAVENOUS | Status: DC | PRN
  Administered 2024-05-21: 20 ug/min via INTRAVENOUS

## 2024-05-21 MED ORDER — OXYTOCIN-SODIUM CHLORIDE 30-0.9 UT/500ML-% IV SOLN
INTRAVENOUS | Status: AC
  Filled 2024-05-21: qty 500

## 2024-05-21 MED ORDER — IBUPROFEN 100 MG PO CHEW: 600.0000 mg | CHEWABLE_TABLET | Freq: Four times a day (QID) | ORAL | Status: DC

## 2024-05-21 MED ORDER — NALOXONE HCL 0.4 MG/ML IJ SOLN: 0.4000 mg | INTRAMUSCULAR | Status: DC | PRN

## 2024-05-21 MED ORDER — NALOXONE HCL 4 MG/10ML IJ SOLN: 1.0000 ug/kg/h | INTRAVENOUS | Status: DC | PRN

## 2024-05-21 MED ORDER — KETOROLAC TROMETHAMINE 30 MG/ML IJ SOLN
INTRAMUSCULAR | Status: DC | PRN
  Administered 2024-05-21: 15 mg via INTRAVENOUS

## 2024-05-21 MED ORDER — PRENATAL MULTIVITAMIN CH: 1.0000 | ORAL_TABLET | Freq: Every day | ORAL | Status: DC

## 2024-05-21 MED ORDER — IBUPROFEN 600 MG PO TABS: 600.0000 mg | ORAL_TABLET | Freq: Four times a day (QID) | ORAL | Status: DC

## 2024-05-21 MED ORDER — 0.9 % SODIUM CHLORIDE (POUR BTL) OPTIME
TOPICAL | Status: DC | PRN
  Administered 2024-05-21: 700 mL

## 2024-05-21 MED ORDER — MORPHINE SULFATE (PF) 0.5 MG/ML IJ SOLN
INTRAMUSCULAR | Status: AC
  Filled 2024-05-21: qty 10

## 2024-05-21 MED ORDER — SOD CITRATE-CITRIC ACID 500-334 MG/5ML PO SOLN
ORAL | Status: AC
  Filled 2024-05-21: qty 15

## 2024-05-21 MED ORDER — LACTATED RINGERS IV BOLUS
1000.0000 mL | Freq: Once | INTRAVENOUS | Status: AC
  Administered 2024-05-21: 1000 mL via INTRAVENOUS

## 2024-05-21 MED ORDER — ONDANSETRON HCL 4 MG/2ML IJ SOLN
INTRAMUSCULAR | Status: DC | PRN
  Administered 2024-05-21: 4 mg via INTRAVENOUS

## 2024-05-21 MED ORDER — LACTATED RINGERS IV SOLN: INTRAVENOUS | Status: DC

## 2024-05-21 MED ORDER — LIDOCAINE HCL (PF) 1 % IJ SOLN
INTRAMUSCULAR | Status: DC | PRN
  Administered 2024-05-21 (×2): 3 mL via SUBCUTANEOUS

## 2024-05-21 MED ORDER — IBUPROFEN 100 MG/5ML PO SUSP
600.0000 mg | Freq: Four times a day (QID) | ORAL | Status: DC
  Filled 2024-05-21 (×2): qty 30

## 2024-05-21 MED ORDER — ACETAMINOPHEN 500 MG PO TABS: 1000.0000 mg | ORAL_TABLET | Freq: Four times a day (QID) | ORAL | Status: DC

## 2024-05-21 MED ORDER — OXYCODONE-ACETAMINOPHEN 5-325 MG PO TABS: 2.0000 | ORAL_TABLET | ORAL | Status: DC | PRN

## 2024-05-21 MED ORDER — OXYTOCIN-SODIUM CHLORIDE 30-0.9 UT/500ML-% IV SOLN
2.5000 [IU]/h | INTRAVENOUS | Status: AC
  Administered 2024-05-21: 2.5 [IU]/h via INTRAVENOUS

## 2024-05-21 MED ORDER — SENNOSIDES-DOCUSATE SODIUM 8.6-50 MG PO TABS: 2.0000 | ORAL_TABLET | ORAL | Status: DC

## 2024-05-21 MED ORDER — BUPIVACAINE HCL (PF) 0.5 % IJ SOLN
INTRAMUSCULAR | Status: DC | PRN
  Administered 2024-05-21: 20 mL

## 2024-05-21 MED ORDER — PHENYLEPHRINE HCL-NACL 20-0.9 MG/250ML-% IV SOLN
INTRAVENOUS | Status: AC
  Filled 2024-05-21: qty 250

## 2024-05-21 MED ORDER — KETOROLAC TROMETHAMINE 15 MG/ML IJ SOLN
15.0000 mg | Freq: Four times a day (QID) | INTRAMUSCULAR | Status: DC
  Filled 2024-05-21: qty 1

## 2024-05-21 MED ORDER — KETOROLAC TROMETHAMINE 30 MG/ML IJ SOLN: 30.0000 mg | Freq: Four times a day (QID) | INTRAMUSCULAR | Status: DC | PRN

## 2024-05-21 MED ORDER — DIPHENHYDRAMINE HCL 50 MG/ML IJ SOLN: 12.5000 mg | INTRAMUSCULAR | Status: DC | PRN

## 2024-05-21 MED ORDER — DEXAMETHASONE SODIUM PHOSPHATE 4 MG/ML IJ SOLN
INTRAMUSCULAR | Status: DC | PRN
  Administered 2024-05-21: 10 mg via INTRAVENOUS

## 2024-05-21 MED ORDER — WITCH HAZEL-GLYCERIN EX PADS: 1.0000 | MEDICATED_PAD | CUTANEOUS | Status: DC | PRN

## 2024-05-21 MED ORDER — DIPHENHYDRAMINE HCL 25 MG PO CAPS: 25.0000 mg | ORAL_CAPSULE | Freq: Four times a day (QID) | ORAL | Status: DC | PRN

## 2024-05-21 SURGICAL SUPPLY — 34 items
BENZOIN TINCTURE PRP APPL 2/3 (GAUZE/BANDAGES/DRESSINGS) ×1 IMPLANT
CATH KIT ON-Q SILVERSOAK 5 (CATHETERS) ×2 IMPLANT
DERMABOND ADVANCED .7 DNX12 (GAUZE/BANDAGES/DRESSINGS) ×1 IMPLANT
DRAPE C SECTION CLR SCREEN (DRAPES) IMPLANT
DRSG OPSITE POSTOP 4X10 (GAUZE/BANDAGES/DRESSINGS) ×1 IMPLANT
DRSG TEGADERM 4X10 (GAUZE/BANDAGES/DRESSINGS) IMPLANT
DRSG TEGADERM 4X4.75 (GAUZE/BANDAGES/DRESSINGS) IMPLANT
DRSG TELFA 3X8 NADH STRL (GAUZE/BANDAGES/DRESSINGS) ×1 IMPLANT
ELECT CAUTERY BLADE 6.4 (BLADE) ×1 IMPLANT
ELECTRODE REM PT RTRN 9FT ADLT (ELECTROSURGICAL) ×1 IMPLANT
FILTER NEPTUNE SMOKE EVACUATOR (MISCELLANEOUS) IMPLANT
GAUZE SPONGE 4X4 12PLY STRL (GAUZE/BANDAGES/DRESSINGS) ×1 IMPLANT
GLOVE BIO SURGEON STRL SZ7 (GLOVE) ×1 IMPLANT
GLOVE INDICATOR 7.5 STRL GRN (GLOVE) ×1 IMPLANT
GOWN STRL REUS W/ TWL LRG LVL3 (GOWN DISPOSABLE) ×3 IMPLANT
GOWN STRL REUS W/ TWL XL LVL3 (GOWN DISPOSABLE) IMPLANT
HANDLE YANKAUER SUCT BULB TIP (MISCELLANEOUS) IMPLANT
MANIFOLD NEPTUNE II (INSTRUMENTS) ×1 IMPLANT
MAT PREVALON FULL STRYKER (MISCELLANEOUS) ×1 IMPLANT
PACK C SECTION AR (MISCELLANEOUS) ×1 IMPLANT
PAD OB MATERNITY 11 LF (PERSONAL CARE ITEMS) ×2 IMPLANT
PAD PREP OB/GYN DISP 24X41 (PERSONAL CARE ITEMS) ×1 IMPLANT
SCRUB CHG 4% DYNA-HEX 4OZ (MISCELLANEOUS) ×1 IMPLANT
SOLN 0.9% NACL POUR BTL 1000ML (IV SOLUTION) ×1 IMPLANT
SOLN STERILE WATER 500 ML (IV SOLUTION) ×1 IMPLANT
STAPLER INSORB 30 2030 C-SECTI (MISCELLANEOUS) IMPLANT
STRIP CLOSURE SKIN 1/2X4 (GAUZE/BANDAGES/DRESSINGS) ×1 IMPLANT
STRIP CLOSURE SKIN 1X5 (GAUZE/BANDAGES/DRESSINGS) IMPLANT
SUT PDS AB 1 TP1 96 (SUTURE) ×1 IMPLANT
SUT VIC AB 0 CTX36XBRD ANBCTRL (SUTURE) ×2 IMPLANT
SUT VIC AB 3-0 SH 27X BRD (SUTURE) IMPLANT
SUTURE MNCRL 4-0 27XMF (SUTURE) ×1 IMPLANT
TRAP FLUID SMOKE EVACUATOR (MISCELLANEOUS) ×1 IMPLANT
TUBING CONNECTING 10 (TUBING) IMPLANT

## 2024-05-21 NOTE — Anesthesia Procedure Notes (Addendum)
 Spinal  Patient location during procedure: OR Start time: 05/21/2024 7:57 AM End time: 05/21/2024 8:02 AM Reason for block: procedure for pain  Staffing Performed: resident/CRNA  Authorized by: Mazzoni, Andrea, MD   Performed by: Dyane Mass, CRNA  Preanesthetic Checklist Completed: patient identified, IV checked, site marked, risks and benefits discussed, surgical consent, monitors and equipment checked and pre-op evaluation Spinal Block Patient position: sitting Prep: ChloraPrep Patient monitoring: heart rate, continuous pulse ox and blood pressure Approach: midline Location: L4-5 Injection technique: single-shot Needle Needle type: Pencan  Needle gauge: 24 G  Additional Notes Successful CSF pull back at beginning and end of injection.

## 2024-05-21 NOTE — Anesthesia Procedure Notes (Signed)
 Spinal  Patient location during procedure: OR Start time: 05/21/2024 8:25 AM End time: 05/21/2024 8:29 AM Reason for block: surgical anesthesia  Staffing Performed: anesthesiologist  Authorized by: Yahira Timberman, MD   Performed by: Shellie Odor, MD  Preanesthetic Checklist Completed: patient identified, IV checked, site marked, risks and benefits discussed, surgical consent, monitors and equipment checked, pre-op evaluation and timeout performed Spinal Block Patient position: sitting Prep: ChloraPrep Patient monitoring: heart rate, cardiac monitor, continuous pulse ox and blood pressure Approach: midline Location: L2-3 Injection technique: single-shot Needle Needle type: Sprotte  Needle gauge: 24 G Needle length: 9 cm Assessment Sensory level: T3 Events: CSF return  Additional Notes Straightforward placement without apparent complications.

## 2024-05-21 NOTE — Interval H&P Note (Signed)
 History and Physical Interval Note:  05/21/2024 7:36 AM  Grace Griffin  has presented today for surgery, with the diagnosis of prior cesarean.  The various methods of treatment have been discussed with the patient and family. After consideration of risks, benefits and other options for treatment, the patient has consented to  Procedures with comments: CESAREAN DELIVERY (N/A) - REPEAT as a surgical intervention.  The patient's history has been reviewed, patient examined, no change in status, stable for surgery.  I have reviewed the patient's chart and labs.  Questions were answered to the patient's satisfaction.    Garnette Mace, MD, [facility] 05/21/2024 7:36 AM

## 2024-05-21 NOTE — Anesthesia Procedure Notes (Deleted)
 Spinal   Staffing Authorized by: Mazzoni, Andrea, MD   Performed by: Dyane Mass, CRNA

## 2024-05-21 NOTE — Anesthesia Preprocedure Evaluation (Addendum)
"                                    Anesthesia Evaluation  Patient identified by MRN, date of birth, ID band Patient awake    Reviewed: Allergy & Precautions, NPO status , Patient's Chart, lab work & pertinent test results  History of Anesthesia Complications (+) PONV and history of anesthetic complications  Airway Mallampati: I   Neck ROM: Full    Dental no notable dental hx.    Pulmonary neg pulmonary ROS   Pulmonary exam normal breath sounds clear to auscultation       Cardiovascular Exercise Tolerance: Good negative cardio ROS Normal cardiovascular exam Rhythm:Regular Rate:Normal     Neuro/Psych  PSYCHIATRIC DISORDERS Anxiety     negative neurological ROS     GI/Hepatic ,GERD  ,,  Endo/Other  negative endocrine ROS    Renal/GU negative Renal ROS     Musculoskeletal   Abdominal   Peds  Hematology negative hematology ROS (+)   Anesthesia Other Findings 28 G3P1011 at 39 2/7 presenting for repeat c-section.  Reproductive/Obstetrics (+) Pregnancy                              Anesthesia Physical Anesthesia Plan  ASA: 2  Anesthesia Plan: Spinal   Post-op Pain Management:    Induction:   PONV Risk Score and Plan: 3 and Ondansetron  and Treatment may vary due to age or medical condition  Airway Management Planned: Natural Airway and Nasal Cannula  Additional Equipment:   Intra-op Plan:   Post-operative Plan:   Informed Consent: I have reviewed the patients History and Physical, chart, labs and discussed the procedure including the risks, benefits and alternatives for the proposed anesthesia with the patient or authorized representative who has indicated his/her understanding and acceptance.     Dental Advisory Given  Plan Discussed with: Anesthesiologist, CRNA and Surgeon  Anesthesia Plan Comments: (Patient reports no bleeding problems and no anticoagulant use.  Plan for spinal with backup GA.  Patient  consented for risks of anesthesia including but not limited to:  - adverse reactions to medications - damage to eyes, teeth, lips or other oral mucosa - nerve damage due to positioning  - risk of bleeding, infection and or nerve damage from spinal that could lead to paralysis - risk of headache or failed spinal - damage to teeth, lips or other oral mucosa - sore throat or hoarseness - damage to heart, brain, nerves, lungs, other parts of body or loss of life  Patient voiced understanding and assent.)         Anesthesia Quick Evaluation  "

## 2024-05-21 NOTE — Discharge Summary (Signed)
 Postpartum Discharge Summary  Patient Name: Grace Griffin DOB: 1995/07/08 MRN: [id number]  Date of admission: 05/21/2024 Delivery date:05/21/2024 Delivering provider: Emelio Schneller D Date of discharge: 05/23/2024  Primary OB: [facility] OFE:Ejupzwu'd last menstrual period was 08/20/2023 (exact date). EDC Estimated Date of Delivery: 05/26/24 Gestational Age at Delivery: [redacted]w[redacted]d   Admitting diagnosis: History of cesarean delivery, currently pregnant [O34.219] History of cesarean delivery [Z98.891] Pregnancy [Z34.90] Intrauterine pregnancy: [redacted]w[redacted]d     Secondary diagnosis:   Principal Problem:   History of cesarean delivery Active Problems:   Supervision of high risk pregnancy, antepartum   [redacted] weeks gestation of pregnancy   Discharge Diagnosis: Term Pregnancy Delivered      Hospital course: Sceduled C/S   28 y.o. yo G2P2002 at [redacted]w[redacted]d was admitted to the hospital 05/21/2024 for scheduled cesarean section with the following indication:Elective Repeat.Delivery details are as follows:  Membrane Rupture Time/Date: 8:34 AM,05/21/2024  Delivery Method:C-Section, Low Transverse Operative Delivery:N/A Details of operation can be found in separate operative note.  Patient had a postpartum course complicated by nothing.  She is ambulating, tolerating a regular diet, passing flatus, and urinating well. Patient is discharged home in stable condition on  05/23/2024        Newborn Data: Birth date:05/21/2024 Birth time:8:51 AM Gender:Female [place] Living status:Living Apgars:8 ,9  Weight:3100 g                                              Post partum procedures:none Complications: None Delivery Type: repeat cesarean section, low transverse incision Anesthesia: spinal anesthesia Placenta: spontaneous To Pathology: No   Prenatal Labs:  Blood type/Rh O positive  Antibody screen negative  Rubella Immune  Varicella Immune    RPR NR (11/02/2023, 02/29/2024)  HBsAg negative   HIV negative (11/01/2023, 02/29/2024)  GC negative  Chlamydia negative  Genetic screening Diploid XX  1 hour GTT 80  3 hour GTT N/a  GBS Negative on 04/30/2024   Magnesium Sulfate received: No BMZ received: No Rhophylac:was not indicated MMR: was not indicated Varivax vaccine given: was not indicated Tdap vaccine: Received 02/29/2024 Flu vaccine: Given 03/21/2024 RSV vaccine:received 04/30/2024  Transfusion:No  Physical exam  Vitals:   05/22/24 0741 05/22/24 1633 05/22/24 2307 05/23/24 0808  BP: 113/69 123/70 118/78 118/74  Pulse: 85 73 80 90  Resp: 20 16 18 20   Temp: 97.7 F (36.5 C) (!) 97.5 F (36.4 C) 97.8 F (36.6 C) 98.1 F (36.7 C)  TempSrc: Oral Oral Oral Oral  SpO2: 99% 100% 100% 98%  Weight:      Height:       General: alert, cooperative, and no distress Lochia: appropriate Uterine Fundus: firm Incision: Healing well with no significant drainage, No significant erythema, Dressing is clean, dry, and intact, covered with occlusive OP site dressing. OnQ pump in place. DVT Evaluation: No evidence of DVT seen on physical exam. No cords or calf tenderness. No significant calf/ankle edema.  Labs: Lab Results  Component Value Date   WBC 14.5 (H) 05/22/2024   HGB 10.7 (L) 05/22/2024   HCT 33.3 (L) 05/22/2024   MCV 82.2 05/22/2024   PLT 162 05/22/2024      Latest Ref Rng & Units 03/05/2024    4:29 PM  CMP  Glucose 70 - 99 mg/dL 91   BUN 6 - 20 mg/dL 8   Creatinine 9.55 - 8.99  mg/dL 9.56   Sodium 864 - 854 mmol/L 138   Potassium 3.5 - 5.1 mmol/L 3.6   Chloride 98 - 111 mmol/L 107   CO2 22 - 32 mmol/L 21   Calcium 8.9 - 10.3 mg/dL 8.7   Total Protein 6.5 - 8.1 g/dL 6.3   Total Bilirubin 0.0 - 1.2 mg/dL 0.2   Alkaline Phos 38 - 126 U/L 47   AST 15 - 41 U/L 19   ALT 0 - 44 U/L 14    Edinburgh Score:    05/22/2024    8:19 AM  Edinburgh Postnatal Depression Scale Screening Tool  I have been able to laugh and see the funny side of things. 0  I have  looked forward with enjoyment to things. 0  I have blamed myself unnecessarily when things went wrong. 0  I have been anxious or worried for no good reason. 1  I have felt scared or panicky for no good reason. 0  Things have been getting on top of me. 0  I have been so unhappy that I have had difficulty sleeping. 0  I have felt sad or miserable. 0  I have been so unhappy that I have been crying. 0  The thought of harming myself has occurred to me. 0  Edinburgh Postnatal Depression Scale Total 1     Postpartum VTE Prophylaxis  Recommend 6 weeks of prophylactic anticoagulation with LMWH or subcutaneous unfractionated heparin if 1 or more high risk factor is present.  Recommend 14 days of prophylactic anticoagulation with LMWH or subcutaneous unfractionated heparin if 3 or more moderate risk factors are present.   Risk assessment for postpartum VTE and prophylactic treatment: High risk factors: None Moderate risk factors: Cesarean delivery   Postpartum VTE prophylaxis with LMWH not indicated    After visit meds:  Allergies as of 05/23/2024       Reactions   Penicillins Other (See Comments)   Childhood reaction        Medication List     TAKE these medications    famotidine -calcium carbonate-magnesium hydroxide 10-800-165 MG chewable tablet Commonly known as: PEPCID  COMPLETE Chew 1-2 tablets by mouth daily as needed (indigestion/heartburn.).   ferrous sulfate  220 (44 Fe) MG/5ML solution Take 5 mLs (220 mg total) by mouth 2 (two) times daily with a meal.   HYDROcodone -acetaminophen  7.5-325 mg/15 ml solution Commonly known as: HYCET Take 10 mLs by mouth every 6 (six) hours as needed (breakthrough pain).   ibuprofen  100 MG/5ML suspension Commonly known as: ADVIL  Take 30 mLs (600 mg total) by mouth every 6 (six) hours as needed.   PRENATAL GUMMIES PO Take 2 each by mouth in the morning. Vitafusion PreNatal Gummy   simethicone  80 MG chewable tablet Commonly known as:  MYLICON Chew 1 tablet (80 mg total) by mouth 3 (three) times daily after meals.               Discharge Care Instructions  (From admission, onward)           Start     Ordered   05/23/24 0000  Discharge wound care:       Comments: Perform wound care instructions   05/23/24 0819           Discharge home in stable condition Infant Feeding: Breast Infant Disposition:home with mother Discharge instruction: per After Visit Summary and Postpartum booklet. Activity: Advance as tolerated. Pelvic rest for 6 weeks.  Diet: routine diet Anticipated Birth Control:  Contraceptives: Undecided Postpartum  Appointment:6 weeks Additional Postpartum F/U: Incision check 1 week Future Appointments:No future appointments. Follow up Visit:  Follow-up Information     Leonce Garnette BIRCH, MD. Go on 05/28/2024.   Specialty: Obstetrics and Gynecology Why: For incision check, (Keep previously scheduled appointment) Contact information: [address] [place] [address] [phone]                 Plan:  Evvie P Sirmons was discharged to home in good condition. Follow-up appointment as directed.    Signed: Garnette Leonce, MD, [facility] 05/23/2024 8:20 AM

## 2024-05-21 NOTE — Transfer of Care (Signed)
 Immediate Anesthesia Transfer of Care Note  Patient: Grace Griffin  Procedure(s) Performed: CESAREAN DELIVERY (Abdomen)  Patient Location: Endoscopy Unit  Anesthesia Type:Spinal  Level of Consciousness: awake, alert , and oriented  Airway & Oxygen Therapy: Patient Spontanous Breathing  Post-op Assessment: Report given to RN and Post -op Vital signs reviewed and stable  Post vital signs: Reviewed and stable  Last Vitals:  Vitals Value Taken Time  BP 91/74   Temp    Pulse 85   Resp 16   SpO2 97%     Last Pain:  Vitals:   05/21/24 0617  TempSrc:   PainSc: 0-No pain         Complications: No notable events documented.

## 2024-05-21 NOTE — Op Note (Signed)
 Cesarean Section Operative Note    Patient Name: Grace Griffin  Date of Birth: Sep 28, 1995  MRN: [id number]  Date of Surgery: 05/21/2024   Pre-operative Diagnosis:  1) History of cesarean delivery, desires repeat 2) intrauterine pregnancy at [redacted]w[redacted]d    Post-operative Diagnosis:  1) History of cesarean delivery, desires repeat 2) intrauterine pregnancy at [redacted]w[redacted]d    Procedure: Repeat Low Transverse Cesarean Delivery via Pfannenstiel incision with double layer uterine closure  Surgeon: Surgeons and Role:    DEWAINE Leonce Garnette JONETTA, MD - Primary  Assistants: Dr. Heather Penton; No other capable assistant available, in surgery requiring high level assistant.  Anesthesia: spinal   Findings:  1) normal appearing gravid uterus, fallopian tubes, and ovaries 2) viable female infant with weight 3,100 grams, APGARs 8 and 9   Quantified Blood Loss: 495 mL   Total IV Fluids: 1,454ml   Urine Output: 105 mL  Specimens: none  Complications: no complications  Disposition: PACU - hemodynamically stable.   Maternal Condition: stable   Baby condition / location:  Couplet care / Skin to Skin  Procedure Details:  The patient was seen in the Holding Room. The risks, benefits, complications, treatment options, and expected outcomes were discussed with the patient. The patient concurred with the proposed plan, giving informed consent. identified as Grace Griffin and the procedure verified as C-Section Delivery. A Time Out was held and the above information confirmed.   After induction of anesthesia, the patient was prepped and draped in the usual sterile manner. A Pfannenstiel incision was made and carried down through the subcutaneous tissue to the fascia. Fascial incision was made and extended transversely. The fascia was separated from the underlying rectus tissue superiorly and inferiorly. The peritoneum was identified and entered. Peritoneal incision was extended longitudinally. The bladder  flap was not bluntly or sharply freed from the lower uterine segment. A low transverse uterine incision was made and the hysterotomy was extended with cranial-caudal tension. Delivered from cephalic presentation was a 3,100 gram Living newborn infant(s) or Female with Apgar scores of 8 at one minute and 9 at five minutes. Cord ph was not sent the umbilical cord was clamped and cut cord blood was obtained for evaluation. The placenta was removed Intact and appeared normal. The uterine outline, tubes and ovaries appeared normal. The uterine incision was closed with running locked sutures of 0 Vicryl.  A second layer of the same suture was thrown in an imbricating fashion.  Hemostasis was assured.  The uterus was returned to the abdomen and the paracolic gutters were cleared of all clots and debris.  The rectus muscles were inspected and found to be hemostatic.  The On-Q catheter pumps were inserted in accordance with the manufacturer's recommendations.  The catheters were inserted approximately 4cm cephelad to the incision line, approximately 1cm apart, straddling the midline.  They were inserted to a depth of the 4th mark. They were positioned superficial to the rectus abdominus muscles and deep to the rectus fascia.    The fascia was then reapproximated with running sutures of 1-0 PDS, looped. The subcuticular closure was performed using 4-0 monocryl. The skin closure was reinforced using surgical skin glue.  The On-Q catheters were bolused with 5 mL of 0.5% marcaine  plain for a total of 10 mL.  The catheters were affixed to the skin with surgical skin glue, steri-strips, and tegaderm.    The surgical assistant performed tissue retraction, assistance with suturing, and fundal pressure.  Instrument, sponge, and needle counts  were correct prior the abdominal closure and were correct at the conclusion of the case.  The patient received Ancef  2 gram IV prior to skin incision (within 30 minutes). For VTE  prophylaxis she was wearing SCDs throughout the case.  The assistant surgeon was an MD due to lack of availability of another sales promotion account executive.    Signed: Garnette CHARM Mace, MD 05/21/2024 9:36 AM

## 2024-05-21 NOTE — Anesthesia Procedure Notes (Deleted)
 Spinal  Patient location during procedure: OR Start time: 05/21/2024 8:26 AM End time: 05/21/2024 8:29 AM Reason for block: procedure for pain  Staffing Performed: anesthesiologist  Authorized by: Mazzoni, Andrea, MD   Performed by: Shellie Odor, MD  Preanesthetic Checklist Completed: patient identified, IV checked, site marked, risks and benefits discussed, surgical consent, monitors and equipment checked and pre-op evaluation Spinal Block Patient position: sitting Prep: ChloraPrep Patient monitoring: heart rate, continuous pulse ox and blood pressure Approach: midline Location: L2-3 Injection technique: single-shot Needle Needle type: Pencan   Additional Notes Spinal did not fully progress high enough - stopped around T10. Second spinal by Dr. Mazzoni at higher level with spinal setting up to T4.

## 2024-05-22 ENCOUNTER — Encounter: Payer: Self-pay | Admitting: Obstetrics and Gynecology

## 2024-05-22 LAB — CBC
HCT: 33.3 % — ABNORMAL LOW (ref 36.0–46.0)
Hemoglobin: 10.7 g/dL — ABNORMAL LOW (ref 12.0–15.0)
MCH: 26.4 pg (ref 26.0–34.0)
MCHC: 32.1 g/dL (ref 30.0–36.0)
MCV: 82.2 fL (ref 80.0–100.0)
Platelets: 162 K/uL (ref 150–400)
RBC: 4.05 MIL/uL (ref 3.87–5.11)
RDW: 13.2 % (ref 11.5–15.5)
WBC: 14.5 K/uL — ABNORMAL HIGH (ref 4.0–10.5)
nRBC: 0 % (ref 0.0–0.2)

## 2024-05-22 NOTE — Anesthesia Postprocedure Evaluation (Signed)
"   Anesthesia Post Note  Patient: Grace Griffin  Procedure(s) Performed: CESAREAN DELIVERY (Abdomen)  Patient location during evaluation: Mother Baby Anesthesia Type: Spinal Level of consciousness: oriented and awake and alert Pain management: pain level controlled Vital Signs Assessment: post-procedure vital signs reviewed and stable Respiratory status: spontaneous breathing and respiratory function stable Cardiovascular status: blood pressure returned to baseline and stable Postop Assessment: no headache, no backache, no apparent nausea or vomiting and able to ambulate Anesthetic complications: no   No notable events documented.   Last Vitals:  Vitals:   05/22/24 0435 05/22/24 0741  BP:  113/69  Pulse:  85  Resp:  20  Temp:  36.5 C  SpO2: 100% 99%    Last Pain:  Vitals:   05/22/24 0741  TempSrc: Oral  PainSc:                  Hailley Byers      "

## 2024-05-22 NOTE — Lactation Note (Signed)
 This note was copied from a baby's chart. Lactation Consultation Note  Patient Name: Grace Griffin Date: 05/22/2024 Age:27 hours Reason for consult: Follow-up assessment;Term   Maternal Data LC met w/ parents walking hallway to ask how feedings are going.  Mom stated they are going well.  Nipples are a little tender but overall good.  She stated that overnight infant wanted to eat a lot.   Mom also wanted to receive assistance from [facility] on her Spectra  pump.  Feeding Mother's Current Feeding Choice: Breast Milk  Lactation Tools Discussed/Used Tools: Shells  Provided sore nipple soft shells to mom to prevent nipples from touching clothing.  Gave education on how to use.  LC assisted mom w/ her blue Spectra  Pump and measured her nipples.  Mom is a size 19mm.  Interventions Interventions: Education;Shells  Discussed cluster feeding and what to expect.  Provided examples of how to heal sore nipples; sore nipple shells, coconut oil or cold compress.   Discharge Discharge Education: Engorgement and breast care  Consult Status Consult Status: Follow-up Follow-up type: In-patient    Morganne Haile S Cambri Plourde 05/22/2024, 4:46 PM

## 2024-05-23 MED ORDER — IBUPROFEN 100 MG/5ML PO SUSP
600.0000 mg | Freq: Four times a day (QID) | ORAL | Status: DC
  Administered 2024-05-23: 600 mg via ORAL
  Filled 2024-05-23 (×7): qty 30

## 2024-05-23 MED ORDER — IBUPROFEN 100 MG/5ML PO SUSP: 600.0000 mg | Freq: Four times a day (QID) | ORAL | 0 refills | Status: AC | PRN

## 2024-05-23 MED ORDER — SIMETHICONE 80 MG PO CHEW: 80.0000 mg | CHEWABLE_TABLET | Freq: Three times a day (TID) | ORAL | 0 refills | Status: AC

## 2024-05-23 MED ORDER — FERROUS SULFATE 220 (44 FE) MG/5ML PO SOLN: 220.0000 mg | Freq: Two times a day (BID) | ORAL | 3 refills | Status: AC

## 2024-05-23 MED ORDER — ONDANSETRON 4 MG PO TBDP
4.0000 mg | ORAL_TABLET | Freq: Three times a day (TID) | ORAL | Status: DC | PRN
  Administered 2024-05-23: 4 mg via ORAL
  Filled 2024-05-23: qty 1

## 2024-05-23 MED ORDER — HYDROCODONE-ACETAMINOPHEN 7.5-325 MG/15ML PO SOLN: 10.0000 mL | Freq: Four times a day (QID) | ORAL | 0 refills | Status: AC | PRN

## 2024-05-23 NOTE — Lactation Note (Signed)
 This note was copied from a baby's chart. Lactation Consultation Note  Patient Name: Grace Griffin Date: 05/23/2024 Age:28 hours Reason for consult: Follow-up assessment;Other (Comment) (Discharge Education)   Maternal Data Lactation to room to provide discharge education and a follow up assessment.  Mom stated that she provided infant a pacifier because last was rough.  She had really bad gas and so they are also trying some tricks in regards to helping her.  Mom also verbalized that her nipples were sore.   Feeding Mother's Current Feeding Choice: Breast Milk  Interventions Interventions: Breast feeding basics reviewed;Education  LC provided some tips on what to do for sore nipples.   Discharge Discharge Education: Engorgement and breast care;Outpatient recommendation  Education on engorgement prevention/treatment was discussed as well as breastmilk storage guidelines.  LC provided patient with a handout on breastmilk storage guidelines from [facility]. [facility] outpatient lactation services phone number written on the white board in the room.  Patient verbalized understanding.  LC also provided education from the postpartum book about warning signs to look for in a poor feeding.    Consult Status Consult Status: Complete Follow-up type: Call as needed    Ricky GORMAN Range 05/23/2024, 12:37 PM

## 2024-05-23 NOTE — Discharge Instructions (Signed)

## 2024-05-25 ENCOUNTER — Encounter: Payer: Self-pay | Admitting: Obstetrics and Gynecology
